# Patient Record
Sex: Male | Born: 1970
Health system: Southern US, Community
[De-identification: ages and names within clinical notes are randomized; demographics above are authoritative.]

## PROBLEM LIST (undated history)

## (undated) DIAGNOSIS — I471 Supraventricular tachycardia, unspecified: Secondary | ICD-10-CM

## (undated) DIAGNOSIS — S3992XA Unspecified injury of lower back, initial encounter: Secondary | ICD-10-CM

## (undated) HISTORY — PX: KNEE SURGERY: SHX244

## (undated) HISTORY — DX: Unspecified injury of lower back, initial encounter: S39.92XA

## (undated) HISTORY — PX: CARDIAC SURGERY: SHX584

## (undated) HISTORY — PX: HAND SURGERY: SHX662

## (undated) HISTORY — PX: ANKLE SURGERY: SHX546

---

## 2004-04-14 HISTORY — PX: CARDIAC SURGERY: SHX584

## 2014-11-18 ENCOUNTER — Emergency Department
Admission: EM | Admit: 2014-11-18 | Discharge: 2014-11-18 | Disposition: A | Discharge status: Home / Self Care | Payer: BLUE CROSS/BLUE SHIELD | Source: Home / Self Care | Attending: Family Medicine | Admitting: Family Medicine

## 2014-11-18 ENCOUNTER — Encounter: Payer: Self-pay | Admitting: Emergency Medicine

## 2014-11-18 DIAGNOSIS — M940 Chondrocostal junction syndrome [Tietze]: Secondary | ICD-10-CM

## 2014-11-18 MED ORDER — HYDROCODONE-ACETAMINOPHEN 5-325 MG PO TABS
ORAL_TABLET | ORAL | Status: DC
Start: 2014-11-18 — End: 2015-02-27

## 2014-11-18 MED ORDER — MELOXICAM 15 MG PO TABS: 15.0000 mg | ORAL_TABLET | Freq: Every day | ORAL | Status: DC

## 2014-11-18 NOTE — Discharge Instructions (Signed)
Apply ice pack for 20 to 30 minutes, 3 to 4 times daily  Continue until pain decreases.  ° ° °Costochondritis °Costochondritis, sometimes called Tietze syndrome, is a swelling and irritation (inflammation) of the tissue (cartilage) that connects your ribs with your breastbone (sternum). It causes pain in the chest and rib area. Costochondritis usually goes away on its own over time. It can take up to 6 weeks or longer to get better, especially if you are unable to limit your activities. °CAUSES  °Some cases of costochondritis have no known cause. Possible causes include: °· Injury (trauma). °· Exercise or activity such as lifting. °· Severe coughing. °SIGNS AND SYMPTOMS °· Pain and tenderness in the chest and rib area. °· Pain that gets worse when coughing or taking deep breaths. °· Pain that gets worse with specific movements. °DIAGNOSIS  °Your health care provider will do a physical exam and ask about your symptoms. Chest X-rays or other tests may be done to rule out other problems. °TREATMENT  °Costochondritis usually goes away on its own over time. Your health care provider may prescribe medicine to help relieve pain. °HOME CARE INSTRUCTIONS  °· Avoid exhausting physical activity. Try not to strain your ribs during normal activity. This would include any activities using chest, abdominal, and side muscles, especially if heavy weights are used. °· Apply ice to the affected area for the first 2 days after the pain begins. °¨ Put ice in a plastic bag. °¨ Place a towel between your skin and the bag. °¨ Leave the ice on for 20 minutes, 2-3 times a day. °· Only take over-the-counter or prescription medicines as directed by your health care provider. °SEEK MEDICAL CARE IF: °· You have redness or swelling at the rib joints. These are signs of infection. °· Your pain does not go away despite rest or medicine. °SEEK IMMEDIATE MEDICAL CARE IF:  °· Your pain increases or you are very uncomfortable. °· You have shortness of  breath or difficulty breathing. °· You cough up blood. °· You have worse chest pains, sweating, or vomiting. °· You have a fever or persistent symptoms for more than 2-3 days. °· You have a fever and your symptoms suddenly get worse. °MAKE SURE YOU:  °· Understand these instructions. °· Will watch your condition. °· Will get help right away if you are not doing well or get worse. °Document Released: 01/08/2005 Document Revised: 01/19/2013 Document Reviewed: 11/02/2012 °[employer]® Patient Information ©2015 [employer]. This information is not intended to replace advice given to you by your health care provider. Make sure you discuss any questions you have with your health care provider. ° °
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
## 2015-02-27 ENCOUNTER — Encounter: Payer: Self-pay | Admitting: *Deleted

## 2015-02-27 ENCOUNTER — Emergency Department
Admission: EM | Admit: 2015-02-27 | Discharge: 2015-02-27 | Disposition: A | Discharge status: Home / Self Care | Payer: BLUE CROSS/BLUE SHIELD | Source: Home / Self Care | Attending: Family Medicine | Admitting: Family Medicine

## 2015-02-27 DIAGNOSIS — R220 Localized swelling, mass and lump, head: Secondary | ICD-10-CM

## 2015-02-27 MED ORDER — DIPHENHYDRAMINE HCL 12.5 MG/5ML PO ELIX
25.0000 mg | ORAL_SOLUTION | Freq: Every day | ORAL | Status: DC | PRN
  Administered 2015-02-27: 25 mg via ORAL

## 2015-02-27 MED ORDER — DIPHENHYDRAMINE HCL 25 MG PO TABS: 25.0000 mg | ORAL_TABLET | ORAL | Status: DC | PRN

## 2015-02-27 MED ORDER — FAMOTIDINE 20 MG PO TABS
20.0000 mg | ORAL_TABLET | Freq: Every day | ORAL | Status: DC
  Administered 2015-02-27: 20 mg via ORAL

## 2015-02-27 MED ORDER — METHYLPREDNISOLONE SODIUM SUCC 40 MG IJ SOLR
80.0000 mg | Freq: Once | INTRAMUSCULAR | Status: AC
  Administered 2015-02-27: 80 mg via INTRAMUSCULAR

## 2015-02-27 MED ORDER — DIPHENHYDRAMINE HCL 50 MG/ML IJ SOLN: 25.0000 mg | Freq: Once | INTRAMUSCULAR | Status: DC

## 2015-02-27 NOTE — ED Notes (Signed)
Pt c/o upper lip swelling x 1 hour.
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
## 2015-02-27 NOTE — Discharge Instructions (Signed)
Call 911 or go to closest ER if symptoms worsen including worsening swelling, throat or tongue swelling, difficulty breathing or swallowing, hives, or other new concerning symptoms develop.

## 2015-02-28 ENCOUNTER — Telehealth: Payer: Self-pay | Admitting: *Deleted

## 2015-06-18 ENCOUNTER — Encounter: Payer: Self-pay | Admitting: Sports Medicine

## 2015-06-18 ENCOUNTER — Ambulatory Visit (INDEPENDENT_AMBULATORY_CARE_PROVIDER_SITE_OTHER): Payer: BLUE CROSS/BLUE SHIELD

## 2015-06-18 ENCOUNTER — Ambulatory Visit (INDEPENDENT_AMBULATORY_CARE_PROVIDER_SITE_OTHER): Payer: BLUE CROSS/BLUE SHIELD | Admitting: Sports Medicine

## 2015-06-18 VITALS — BP 123/82 | HR 62 | Wt 175.0 lb

## 2015-06-18 DIAGNOSIS — S43109A Unspecified dislocation of unspecified acromioclavicular joint, initial encounter: Secondary | ICD-10-CM | POA: Insufficient documentation

## 2015-06-18 DIAGNOSIS — M25511 Pain in right shoulder: Secondary | ICD-10-CM | POA: Diagnosis not present

## 2015-06-18 DIAGNOSIS — R202 Paresthesia of skin: Secondary | ICD-10-CM | POA: Diagnosis not present

## 2015-06-18 DIAGNOSIS — S43101A Unspecified dislocation of right acromioclavicular joint, initial encounter: Secondary | ICD-10-CM

## 2015-06-18 DIAGNOSIS — M5412 Radiculopathy, cervical region: Secondary | ICD-10-CM | POA: Insufficient documentation

## 2015-06-18 MED ORDER — MELOXICAM 15 MG PO TABS: ORAL_TABLET | ORAL | Status: DC
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
## 2015-06-18 NOTE — Assessment & Plan Note (Signed)
X-rays, meloxicam, return in one month, injection if no better. Advised of the natural history of a shoulder separation.

## 2015-07-16 ENCOUNTER — Ambulatory Visit: Payer: BLUE CROSS/BLUE SHIELD | Admitting: Sports Medicine

## 2015-07-17 ENCOUNTER — Ambulatory Visit (INDEPENDENT_AMBULATORY_CARE_PROVIDER_SITE_OTHER): Payer: BLUE CROSS/BLUE SHIELD | Admitting: Sports Medicine

## 2015-07-17 VITALS — BP 125/73 | HR 60 | Resp 16 | Wt 175.5 lb

## 2015-07-17 DIAGNOSIS — S43101A Unspecified dislocation of right acromioclavicular joint, initial encounter: Secondary | ICD-10-CM | POA: Diagnosis not present

## 2015-07-17 DIAGNOSIS — R202 Paresthesia of skin: Secondary | ICD-10-CM

## 2015-07-17 NOTE — Assessment & Plan Note (Signed)
Confirmed on x-ray, pain is tolerable with meloxicam. No changes, we did discuss injection therapy.

## 2015-07-17 NOTE — Progress Notes (Signed)
  Subjective:    CC: follow-up  HPI: Right acromioclavicular separation: Pain is tolerable on meloxicam. Understands that this is simply going to take time.  Paresthesias and right hand: Pain over the second MCP as well as with repeated clicking on his mouse. He did have some paresthesias so we are still awaiting nerve conduction and EMG.  Past medical history, Surgical history, Family history not pertinant except as noted below, Social history, Allergies, and medications have been entered into the medical record, reviewed, and no changes needed.   Review of Systems: No fevers, chills, night sweats, weight loss, chest pain, or shortness of breath.   Objective:    General: Well Developed, well nourished, and in no acute distress.  Neuro: Alert and oriented x3, extra-ocular muscles intact, sensation grossly intact.  HEENT: Normocephalic, atraumatic, pupils equal round reactive to light, neck supple, no masses, no lymphadenopathy, thyroid nonpalpable.  Skin: Warm and dry, no rashes. Cardiac: Regular rate and rhythm, no murmurs rubs or gallops, no lower extremity edema.  Respiratory: Clear to auscultation bilaterally. Not using accessory muscles, speaking in full sentences. Right hand: Slightly swollen MCP, second, but no palpable tenderness.  Impression and Recommendations:    I spent 25 minutes with this patient, greater than 50% was face-to-face time counseling regarding the above diagnoses

## 2015-07-17 NOTE — Assessment & Plan Note (Signed)
I do suspect that this represents a second MCP synovitis versus extensor indicis tenosynovitis. If insufficient response by the next visit we will proceed with MRI of the hand, still awaiting nerve conduction and EMG.

## 2015-07-30 ENCOUNTER — Encounter: Payer: Self-pay | Admitting: Neurology

## 2015-07-30 ENCOUNTER — Ambulatory Visit (INDEPENDENT_AMBULATORY_CARE_PROVIDER_SITE_OTHER): Payer: BLUE CROSS/BLUE SHIELD | Admitting: Neurology

## 2015-07-30 ENCOUNTER — Ambulatory Visit (INDEPENDENT_AMBULATORY_CARE_PROVIDER_SITE_OTHER): Payer: Self-pay | Admitting: Neurology

## 2015-07-30 DIAGNOSIS — R202 Paresthesia of skin: Secondary | ICD-10-CM
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
## 2015-07-30 NOTE — Progress Notes (Signed)
Please refer to EMG and NCV procedure note. 

## 2016-02-08 ENCOUNTER — Other Ambulatory Visit: Payer: Self-pay | Admitting: Sports Medicine

## 2016-02-08 DIAGNOSIS — S43101A Unspecified dislocation of right acromioclavicular joint, initial encounter: Secondary | ICD-10-CM

## 2016-03-03 ENCOUNTER — Ambulatory Visit (INDEPENDENT_AMBULATORY_CARE_PROVIDER_SITE_OTHER): Payer: BLUE CROSS/BLUE SHIELD

## 2016-03-03 ENCOUNTER — Ambulatory Visit (INDEPENDENT_AMBULATORY_CARE_PROVIDER_SITE_OTHER): Payer: BLUE CROSS/BLUE SHIELD | Admitting: Sports Medicine

## 2016-03-03 ENCOUNTER — Encounter: Payer: Self-pay | Admitting: Sports Medicine

## 2016-03-03 DIAGNOSIS — M7062 Trochanteric bursitis, left hip: Secondary | ICD-10-CM

## 2016-03-03 DIAGNOSIS — M25552 Pain in left hip: Secondary | ICD-10-CM

## 2016-03-03 DIAGNOSIS — M5412 Radiculopathy, cervical region: Secondary | ICD-10-CM

## 2016-03-03 DIAGNOSIS — M7061 Trochanteric bursitis, right hip: Secondary | ICD-10-CM | POA: Diagnosis not present

## 2016-03-03 DIAGNOSIS — M1612 Unilateral primary osteoarthritis, left hip: Secondary | ICD-10-CM | POA: Insufficient documentation

## 2016-03-03 MED ORDER — GABAPENTIN 100 MG PO CAPS: 100.0000 mg | ORAL_CAPSULE | Freq: Every day | ORAL | 11 refills | Status: DC

## 2016-03-03 MED ORDER — PREDNISONE 50 MG PO TABS: ORAL_TABLET | ORAL | 0 refills | Status: DC

## 2016-03-03 NOTE — Assessment & Plan Note (Signed)
Nerve conduction study in the past simply showed some evidence of chronic median nerve injury. Symptoms today seem to be localized to the C7 nerve root. Home rehabilitation exercises, x-rays, prednisone, gabapentin 100 mg in the morning.

## 2016-03-03 NOTE — Assessment & Plan Note (Signed)
Injection as above. Home rehabilitation exercises. Hip x-rays. Return in one month.

## 2016-03-03 NOTE — Progress Notes (Signed)
   Subjective:    I'm seeing this patient as a consultation for:  Dr. Jeannett SeniorStephen Nelson  CC: Right arm pain, left hip pain  HPI: Right arm pain: Has been occurring for several months, worse during his long hours at work clicking on the computer, gets radiating pain down the posterior aspect of his arm, all the way down to his second and sometimes third finger. Feels better with his arm abducted behind his head. Occasional numbness and tingling.  Left hip pain: Localized over the greater, moderate, persistent without radiation.  Past medical history:  Negative.  See flowsheet/record as well for more information.  Surgical history: Negative.  See flowsheet/record as well for more information.  Family history: Negative.  See flowsheet/record as well for more information.  Social history: Negative.  See flowsheet/record as well for more information.  Allergies, and medications have been entered into the medical record, reviewed, and no changes needed.   Review of Systems: No headache, visual changes, nausea, vomiting, diarrhea, constipation, dizziness, abdominal pain, skin rash, fevers, chills, night sweats, weight loss, swollen lymph nodes, body aches, joint swelling, muscle aches, chest pain, shortness of breath, mood changes, visual or auditory hallucinations.   Objective:   General: Well Developed, well nourished, and in no acute distress.  Neuro/Psych: Alert and oriented x3, extra-ocular muscles intact, able to move all 4 extremities, sensation grossly intact. Skin: Warm and dry, no rashes noted.  Respiratory: Not using accessory muscles, speaking in full sentences, trachea midline.  Cardiovascular: Pulses palpable, no extremity edema. Abdomen: Does not appear distended. Neck: Negative spurling's Full neck range of motion Grip strength and sensation normal in bilateral hands Strength good C4 to T1 distribution No sensory change to C4 to T1 Reflexes normal Left Hip: ROM IR: 60 Deg, ER:  60 Deg, Flexion: 120 Deg, Extension: 100 Deg, Abduction: 45 Deg, Adduction: 45 Deg Strength IR: 5/5, ER: 5/5, Flexion: 5/5, Extension: 5/5, Abduction: 5/5, Adduction: 5/5 Pelvic alignment unremarkable to inspection and palpation. Standing hip rotation and gait without trendelenburg / unsteadiness. Greater trochanter with severe tenderness to palpation. No tenderness over piriformis. No SI joint tenderness and normal minimal SI movement.  Procedure: Real-time Ultrasound Guided Injection of left greater trochanteric bursa Device: GE Logiq E  Verbal informed consent obtained.  Time-out conducted.  Noted no overlying erythema, induration, or other signs of local infection.  Skin prepped in a sterile fashion.  Local anesthesia: Topical Ethyl chloride.  With sterile technique and under real time ultrasound guidance: 22-gauge spinal needle advanced just deep to the insertion of the gluteus medius, 1 mL kenalog 40, 2 mL lidocaine, 2 mL Marcaine injected easily.  Completed without difficulty  Pain immediately resolved suggesting accurate placement of the medication.  Advised to call if fevers/chills, erythema, induration, drainage, or persistent bleeding.  Images permanently stored and available for review in the ultrasound unit.  Impression: Technically successful ultrasound guided injection.  Impression and Recommendations:   This case required medical decision making of moderate complexity.  Right cervical radiculopathy Nerve conduction study in the past simply showed some evidence of chronic median nerve injury. Symptoms today seem to be localized to the C7 nerve root. Home rehabilitation exercises, x-rays, prednisone, gabapentin 100 mg in the morning.  Greater trochanteric bursitis, left Injection as above. Home rehabilitation exercises. Hip x-rays. Return in one month.

## 2016-03-04 ENCOUNTER — Encounter: Payer: Self-pay | Admitting: Sports Medicine

## 2016-03-31 ENCOUNTER — Encounter: Payer: Self-pay | Admitting: Sports Medicine

## 2016-03-31 ENCOUNTER — Ambulatory Visit (INDEPENDENT_AMBULATORY_CARE_PROVIDER_SITE_OTHER): Payer: BLUE CROSS/BLUE SHIELD | Admitting: Sports Medicine

## 2016-03-31 DIAGNOSIS — M7062 Trochanteric bursitis, left hip: Secondary | ICD-10-CM | POA: Diagnosis not present

## 2016-03-31 DIAGNOSIS — M79671 Pain in right foot: Secondary | ICD-10-CM | POA: Diagnosis not present

## 2016-03-31 DIAGNOSIS — M5412 Radiculopathy, cervical region: Secondary | ICD-10-CM | POA: Diagnosis not present

## 2016-03-31 NOTE — Assessment & Plan Note (Signed)
C7 distribution radiculitis, continue rehabilitation exercises, gabapentin seems to control pain fairly well. Did have mild multilevel cervical degenerative changes, no need for MRI just yet as symptoms are controlled with conservative measures.

## 2016-03-31 NOTE — Assessment & Plan Note (Signed)
Did better after injection, had a bit of recurrence of pain but will focus on the rehabilitation exercises.

## 2016-03-31 NOTE — Assessment & Plan Note (Signed)
Right foot pain under the navicular consistent with tibialis posterior tendinitis versus navicular stress injury. He does have past cavus, will return for custom molded orthotics.

## 2016-03-31 NOTE — Progress Notes (Signed)
   Subjective:    I'm seeing this patient as a consultation for:  Dr. Jeannett SeniorStephen Hux  CC: Foot pain  HPI: For the past several months this pleasant 45 year old male has had pain that he localizes on the plantar aspect of his right foot just under the navicular, moderate, persistent, radiation, no trauma, no change in surfaces or footwear.  Left trochanteric bursitis: Did better after injection, he things he overdid it playing ping-pong, but overall is calming down again.  Right cervical radiculopathy: C7 distribution, essentially resolved with low-dose gabapentin and rehabilitation exercises.  Past medical history:  Negative.  See flowsheet/record as well for more information.  Surgical history: Negative.  See flowsheet/record as well for more information.  Family history: Negative.  See flowsheet/record as well for more information.  Social history: Negative.  See flowsheet/record as well for more information.  Allergies, and medications have been entered into the medical record, reviewed, and no changes needed.   Review of Systems: No headache, visual changes, nausea, vomiting, diarrhea, constipation, dizziness, abdominal pain, skin rash, fevers, chills, night sweats, weight loss, swollen lymph nodes, body aches, joint swelling, muscle aches, chest pain, shortness of breath, mood changes, visual or auditory hallucinations.   Objective:   General: Well Developed, well nourished, and in no acute distress.  Neuro/Psych: Alert and oriented x3, extra-ocular muscles intact, able to move all 4 extremities, sensation grossly intact. Skin: Warm and dry, no rashes noted.  Respiratory: Not using accessory muscles, speaking in full sentences, trachea midline.  Cardiovascular: Pulses palpable, no extremity edema. Abdomen: Does not appear distended. Right Foot: No visible erythema or swelling. Range of motion is full in all directions. Strength is 5/5 in all directions. No hallux valgus. No pes  cavus or pes planus. No abnormal callus noted. No pain over the navicular prominence, or base of fifth metatarsal. No tenderness to palpation of the calcaneal insertion of plantar fascia. No pain at the Achilles insertion. No pain over the calcaneal bursa. No pain of the retrocalcaneal bursa. Tender to palpation over the navicular No hallux rigidus or limitus. No tenderness palpation over interphalangeal joints. No pain with compression of the metatarsal heads. Neurovascularly intact distally.  Impression and Recommendations:   This case required medical decision making of moderate complexity.  Right foot pain Right foot pain under the navicular consistent with tibialis posterior tendinitis versus navicular stress injury. He does have past cavus, will return for custom molded orthotics.  Greater trochanteric bursitis, left Did better after injection, had a bit of recurrence of pain but will focus on the rehabilitation exercises.  Right cervical radiculopathy C7 distribution radiculitis, continue rehabilitation exercises, gabapentin seems to control pain fairly well. Did have mild multilevel cervical degenerative changes, no need for MRI just yet as symptoms are controlled with conservative measures.

## 2016-04-01 ENCOUNTER — Encounter: Payer: Self-pay | Admitting: Sports Medicine

## 2016-04-01 ENCOUNTER — Ambulatory Visit (INDEPENDENT_AMBULATORY_CARE_PROVIDER_SITE_OTHER): Payer: BLUE CROSS/BLUE SHIELD | Admitting: Sports Medicine

## 2016-04-01 DIAGNOSIS — M79671 Pain in right foot: Secondary | ICD-10-CM | POA: Diagnosis not present

## 2016-04-01 NOTE — Assessment & Plan Note (Signed)
Suspect right tibialis posterior tendinitis versus navicular stress injury, custom orthotics as above for pes cavus. Return in one month.

## 2016-04-01 NOTE — Progress Notes (Signed)

## 2016-09-16 DIAGNOSIS — M47812 Spondylosis without myelopathy or radiculopathy, cervical region: Secondary | ICD-10-CM | POA: Insufficient documentation

## 2016-09-26 DIAGNOSIS — M51369 Other intervertebral disc degeneration, lumbar region without mention of lumbar back pain or lower extremity pain: Secondary | ICD-10-CM | POA: Insufficient documentation

## 2016-12-17 ENCOUNTER — Emergency Department (INDEPENDENT_AMBULATORY_CARE_PROVIDER_SITE_OTHER)
Admission: EM | Admit: 2016-12-17 | Discharge: 2016-12-17 | Disposition: A | Discharge status: Home / Self Care | Payer: BLUE CROSS/BLUE SHIELD | Source: Home / Self Care | Attending: Family Medicine | Admitting: Family Medicine

## 2016-12-17 ENCOUNTER — Other Ambulatory Visit: Payer: Self-pay | Admitting: Family Medicine

## 2016-12-17 ENCOUNTER — Encounter: Payer: Self-pay | Admitting: *Deleted

## 2016-12-17 ENCOUNTER — Emergency Department (INDEPENDENT_AMBULATORY_CARE_PROVIDER_SITE_OTHER): Payer: BLUE CROSS/BLUE SHIELD

## 2016-12-17 DIAGNOSIS — K5909 Other constipation: Secondary | ICD-10-CM

## 2016-12-17 DIAGNOSIS — R1030 Lower abdominal pain, unspecified: Secondary | ICD-10-CM

## 2016-12-17 DIAGNOSIS — R109 Unspecified abdominal pain: Secondary | ICD-10-CM

## 2016-12-17 DIAGNOSIS — R140 Abdominal distension (gaseous): Secondary | ICD-10-CM

## 2016-12-17 LAB — POCT URINALYSIS DIP (MANUAL ENTRY)
Bilirubin, UA: NEGATIVE
Blood, UA: NEGATIVE
Glucose, UA: NEGATIVE mg/dL
Ketones, POC UA: NEGATIVE mg/dL
LEUKOCYTES UA: NEGATIVE
Nitrite, UA: NEGATIVE
PROTEIN UA: NEGATIVE mg/dL
Spec Grav, UA: 1.01 (ref 1.010–1.025)
UROBILINOGEN UA: 1 U/dL
pH, UA: 7 (ref 5.0–8.0)

## 2016-12-17 LAB — POCT CBC W AUTO DIFF (K'VILLE URGENT CARE)

## 2016-12-17 NOTE — Discharge Instructions (Signed)
Recommend Miralax, one capful daily mixed with 4 to 8 ounces liquid.

## 2016-12-17 NOTE — ED Provider Notes (Signed)
[facility]    CSN: [id number] Arrival date & time: 12/17/16  1038     History   Chief Complaint Chief Complaint  Patient presents with  . Abdominal Pain    HPI Ulmer Degen is a 46 y.o. male.   Patient complains of several year history of infrequently occurring brief right lower quadrant pain (about once per week).  During the past five days he has had persistent constipation, passing only some fluid after trying an enema two days ago.  He tried a second enema yesterday without response.  He has note increased flatus.  His stools had been normal prior to five days ago.  No nausea/vomiting.  No melena or hematochezia.  No history of abdominal surgery.  No GU symptoms. He has a family history of hypothyroid (maternal grandmother).   The history is provided by the patient.  Abdominal Pain  Pain location:  LLQ and RLQ Pain quality: aching and bloating   Pain radiates to:  Does not radiate Pain severity:  Mild Onset quality:  Sudden Duration:  5 hours Timing:  Constant Progression:  Unchanged Chronicity:  New Context: not awakening from sleep, not diet changes, not eating, not laxative use, not medication withdrawal, not previous surgeries, not recent illness, not recent travel, not retching, not sick contacts, not suspicious food intake and not trauma   Relieved by:  Nothing Worsened by:  Nothing Ineffective treatments: enemas and laxative. Associated symptoms: anorexia, belching, constipation, fatigue and flatus   Associated symptoms: no chest pain, no chills, no cough, no diarrhea, no dysuria, no fever, no hematemesis, no hematochezia, no hematuria, no melena, no nausea, no shortness of breath, no sore throat and no vomiting   Risk factors: has not had multiple surgeries     History reviewed. No pertinent past medical history.  Patient Active Problem List   Diagnosis Date Noted  . Right foot pain 03/31/2016  . Greater trochanteric bursitis, left 03/03/2016  .  AC separation, type 2 06/18/2015  . Right cervical radiculopathy 06/18/2015    Past Surgical History:  Procedure Laterality Date  . ANKLE SURGERY    . CARDIAC SURGERY    . HAND SURGERY    . KNEE SURGERY         Home Medications    Prior to Admission medications   Medication Sig Start Date End Date Taking? Authorizing Provider  aspirin 81 MG tablet Take 81 mg by mouth daily.   Yes [provider]  busPIRone (BUSPAR) 15 MG tablet Take by mouth. 03/01/13  Yes [provider]  gabapentin (NEURONTIN) 100 MG capsule Take 1 capsule (100 mg total) by mouth daily with breakfast. 03/03/16  Yes Monica Becton, MD  ibuprofen (ADVIL,MOTRIN) 400 MG tablet Take 400 mg by mouth every 6 (six) hours as needed.   Yes [provider]  UNKNOWN TO PATIENT    Yes [provider]    Family History History reviewed. No pertinent family history.  Social History Social History  Substance Use Topics  . Smoking status: Never Smoker  . Smokeless tobacco: Never Used  . Alcohol use No     Allergies   Patient has no known allergies.   Review of Systems Review of Systems  Constitutional: Positive for fatigue. Negative for chills and fever.  HENT: Negative for sore throat.   Respiratory: Negative for cough and shortness of breath.   Cardiovascular: Negative for chest pain.  Gastrointestinal: Positive for abdominal pain, anorexia, constipation and flatus. Negative for  diarrhea, hematemesis, hematochezia, melena, nausea and vomiting.  Genitourinary: Negative for dysuria and hematuria.  All other systems reviewed and are negative.    Physical Exam Triage Vital Signs ED Triage Vitals  Enc Vitals Group     BP 12/17/16 1054 131/89     Pulse Rate 12/17/16 1054 62     Resp 12/17/16 1054 16     Temp 12/17/16 1054 98 F (36.7 C)     Temp Source 12/17/16 1054 Oral     SpO2 12/17/16 1054 100 %     Weight 12/17/16 1055 187 lb (84.8 kg)     Height 12/17/16  1055 5\' 8"  (1.727 m)     Head Circumference --      Peak Flow --      Pain Score 12/17/16 1055 2     Pain Loc --      Pain Edu? --      Excl. in [place]? --    No data found.   Updated Vital Signs BP 131/89 (BP Location: Left Arm)   Pulse 62   Temp 98 F (36.7 C) (Oral)   Resp 16   Ht 5\' 8"  (1.727 m)   Wt 187 lb (84.8 kg)   SpO2 100%   BMI 28.43 kg/m   Visual Acuity Right Eye Distance:   Left Eye Distance:   Bilateral Distance:    Right Eye Near:   Left Eye Near:    Bilateral Near:     Physical Exam Nursing notes and Vital Signs reviewed. Appearance:  Patient appears stated age, and in no acute distress Eyes:  Pupils are equal, round, and reactive to light and accomodation.  Extraocular movement is intact.  Conjunctivae are not inflamed  Ears:  Normal externally. Nose:  Normal   Pharynx:  Normal; moist mucous membranes  Neck:  Supple.  No adenopathy.  Lungs:  Clear to auscultation.  Breath sounds are equal.  Moving air well. Heart:  Regular rate and rhythm without murmurs, rubs, or gallops.  Abdomen:   Vague tenderness right lower quadrant  without masses or hepatosplenomegaly.  Bowel sounds are present.  No CVA or flank tenderness.  No rebound tenderness. Extremities:  No edema.  Skin:  No rash present.    UC Treatments / Results  Labs (all labs ordered are listed, but only abnormal results are displayed) Labs Reviewed  TSH  POCT CBC W AUTO DIFF ([facility]):  WBC 7.7; LY 18.4; MO 3.7; GR 77.9; Hgb 15.0; Platelets 275   POCT URINALYSIS DIP (MANUAL ENTRY) negative    EKG  EKG Interpretation None       Radiology Dg Abdomen 1 View  Result Date: 12/17/2016 CLINICAL DATA:  Abdominal pain, bloating EXAM: ABDOMEN - 1 VIEW COMPARISON:  None. FINDINGS: The bowel gas pattern is normal. No radio-opaque calculi or other significant radiographic abnormality are seen. IMPRESSION: Negative. Electronically Signed   By: Charlett NoseKevin  Dover M.D.   On: 12/17/2016 11:43     Procedures Procedures (including critical care time)  Medications Ordered in UC Medications - No data to display   Initial Impression / Assessment and Plan / UC Course  I have reviewed the triage vital signs and the nursing notes.  Pertinent labs & imaging results that were available during my care of the patient were reviewed by me and considered in my medical decision making (see chart for details).    Unremarkable bowel gas pattern on KUB. Normal CBC and urinalysis reassuring.  TSH pending. Recommend  Miralax, one capful daily mixed with 4 to 8 ounces liquid. Followup with PCP if symptoms persist.    Final Clinical Impressions(s) / UC Diagnoses   Final diagnoses:  Lower abdominal pain  Other constipation    New Prescriptions New Prescriptions   No medications on file         Lattie Haw, MD 12/17/16 1258

## 2016-12-17 NOTE — ED Triage Notes (Signed)
Pt c/o RLQ abd intermittent "pinching" sensation for "a few years". Since 12/12/16 he reports no BM, bloating and pain in his RLQ. He has done 2 enemas, taken maalox and increased his fluid intake without relief.

## 2016-12-18 ENCOUNTER — Telehealth: Payer: Self-pay | Admitting: *Deleted

## 2016-12-18 LAB — TSH: TSH: 1.48 m[IU]/L (ref 0.40–4.50)

## 2016-12-18 NOTE — Telephone Encounter (Signed)
Patient advised of TSH results.

## 2017-03-18 ENCOUNTER — Other Ambulatory Visit: Payer: Self-pay | Admitting: Sports Medicine

## 2017-03-18 DIAGNOSIS — M5412 Radiculopathy, cervical region: Secondary | ICD-10-CM

## 2018-03-10 ENCOUNTER — Other Ambulatory Visit: Payer: Self-pay | Admitting: Sports Medicine

## 2018-03-10 DIAGNOSIS — M5412 Radiculopathy, cervical region: Secondary | ICD-10-CM

## 2018-11-23 DIAGNOSIS — M47812 Spondylosis without myelopathy or radiculopathy, cervical region: Secondary | ICD-10-CM | POA: Diagnosis not present

## 2018-11-23 DIAGNOSIS — R635 Abnormal weight gain: Secondary | ICD-10-CM | POA: Diagnosis not present

## 2018-11-23 DIAGNOSIS — Z0000 Encounter for general adult medical examination without abnormal findings: Secondary | ICD-10-CM | POA: Diagnosis not present

## 2018-11-23 DIAGNOSIS — R7989 Other specified abnormal findings of blood chemistry: Secondary | ICD-10-CM | POA: Diagnosis not present

## 2019-04-20 DIAGNOSIS — L309 Dermatitis, unspecified: Secondary | ICD-10-CM | POA: Diagnosis not present

## 2019-04-20 HISTORY — DX: Dermatitis, unspecified: L30.9

## 2019-06-22 DIAGNOSIS — L821 Other seborrheic keratosis: Secondary | ICD-10-CM | POA: Diagnosis not present

## 2019-06-22 DIAGNOSIS — L649 Androgenic alopecia, unspecified: Secondary | ICD-10-CM | POA: Diagnosis not present

## 2019-06-22 DIAGNOSIS — D2270 Melanocytic nevi of unspecified lower limb, including hip: Secondary | ICD-10-CM | POA: Diagnosis not present

## 2019-06-22 DIAGNOSIS — L816 Other disorders of diminished melanin formation: Secondary | ICD-10-CM | POA: Diagnosis not present

## 2019-07-20 DIAGNOSIS — Z23 Encounter for immunization: Secondary | ICD-10-CM | POA: Diagnosis not present

## 2019-09-02 ENCOUNTER — Other Ambulatory Visit: Payer: Self-pay

## 2019-09-02 ENCOUNTER — Emergency Department (INDEPENDENT_AMBULATORY_CARE_PROVIDER_SITE_OTHER)
Admission: EM | Admit: 2019-09-02 | Discharge: 2019-09-02 | Disposition: A | Discharge status: Home / Self Care | Payer: BC Managed Care – PPO | Source: Home / Self Care | Attending: Family Medicine | Admitting: Family Medicine

## 2019-09-02 ENCOUNTER — Encounter: Payer: Self-pay | Admitting: Emergency Medicine

## 2019-09-02 DIAGNOSIS — S76312A Strain of muscle, fascia and tendon of the posterior muscle group at thigh level, left thigh, initial encounter: Secondary | ICD-10-CM | POA: Diagnosis not present

## 2019-09-02 HISTORY — DX: Supraventricular tachycardia: I47.1

## 2019-09-02 HISTORY — DX: Supraventricular tachycardia, unspecified: I47.10

## 2019-09-02 MED ORDER — CYCLOBENZAPRINE HCL 10 MG PO TABS: 10.0000 mg | ORAL_TABLET | Freq: Three times a day (TID) | ORAL | 1 refills | Status: DC | PRN

## 2019-09-02 NOTE — ED Provider Notes (Signed)
[facility]    CSN: [id number] Arrival date & time: 09/02/19  1034      History   Chief Complaint Chief Complaint  Patient presents with  . Leg Pain    left thigh    HPI George Nelson is a 49 y.o. male.   At 2130 last night, while approaching a stair, patient suddenly felt sharp pain in his left posterior thigh as he lifted his foot to the first stair tread.  He has had persistent pain in his posterior thigh with walking, climbing stairs etc.      Leg Pain Location:  Leg Time since incident:  1 day Injury: no   Leg location:  L upper leg Pain details:    Quality:  Shooting and sharp   Radiates to:  Does not radiate   Severity:  Severe   Onset quality:  Sudden   Duration:  1 day   Timing:  Constant   Progression:  Unchanged Chronicity:  New Prior injury to area:  No Relieved by:  Nothing Worsened by:  Bearing weight and activity Ineffective treatments:  None tried Associated symptoms: decreased ROM, muscle weakness, swelling and tingling   Associated symptoms: no fatigue, no fever and no numbness     Past Medical History:  Diagnosis Date  . SVT (supraventricular tachycardia) [facility])     Patient Active Problem List   Diagnosis Date Noted  . Right foot pain 03/31/2016  . Greater trochanteric bursitis, left 03/03/2016  . AC separation, type 2 06/18/2015  . Right cervical radiculopathy 06/18/2015    Past Surgical History:  Procedure Laterality Date  . ANKLE SURGERY    . CARDIAC SURGERY  2006   Ablation   . HAND SURGERY    . KNEE SURGERY         Home Medications    Prior to Admission medications   Medication Sig Start Date End Date Taking? Authorizing Provider  aspirin 81 MG tablet Take 81 mg by mouth daily.   Yes [provider]  ibuprofen (ADVIL,MOTRIN) 400 MG tablet Take 400 mg by mouth every 6 (six) hours as needed.   Yes [provider]  metoprolol tartrate (LOPRESSOR) 50 MG tablet TAKE 1/2 TO 1 TABLET BY MOUTH  TWICE DAILY AS NEEDED 08/17/19  Yes [provider]  busPIRone (BUSPAR) 15 MG tablet Take by mouth. 03/01/13   [provider]  cyclobenzaprine (FLEXERIL) 10 MG tablet Take 1 tablet (10 mg total) by mouth 3 (three) times daily as needed for muscle spasms. 09/02/19   Lattie Haw, MD  gabapentin (NEURONTIN) 100 MG capsule TAKE ONE CAPSULE BY MOUTH ONCE DAILY WITH  BREAKFAST 03/18/17   Monica Becton, MD  UNKNOWN TO PATIENT     [provider]    Family History Family History  Problem Relation Age of Onset  . Healthy Mother   . Healthy Father   . Healthy Brother     Social History Social History   Tobacco Use  . Smoking status: Never Smoker  . Smokeless tobacco: Never Used  Substance Use Topics  . Alcohol use: No  . Drug use: No     Allergies   Patient has no known allergies.   Review of Systems Review of Systems  Constitutional: Positive for activity change. Negative for chills, diaphoresis, fatigue and fever.  Respiratory: Negative for chest tightness and shortness of breath.   Cardiovascular: Negative for chest pain and leg swelling.  Musculoskeletal: Negative for joint swelling.  Skin: Negative for color change.  All other systems reviewed and are negative.    Physical Exam Triage Vital Signs ED Triage Vitals  Enc Vitals Group     BP 09/02/19 1049 (!) 133/95     Pulse Rate 09/02/19 1049 67     Resp 09/02/19 1049 18     Temp 09/02/19 1049 98.7 F (37.1 C)     Temp Source 09/02/19 1049 Oral     SpO2 09/02/19 1049 98 %     Weight 09/02/19 1042 195 lb (88.5 kg)     Height 09/02/19 1042 5\' 8"  (1.727 m)     Head Circumference --      Peak Flow --      Pain Score 09/02/19 1044 7     Pain Loc --      Pain Edu? --      Excl. in [place]? --    No data found.  Updated Vital Signs BP (!) 133/95 (BP Location: Left Arm)   Pulse 67   Temp 98.7 F (37.1 C) (Oral)   Resp 18   Ht 5\' 8"  (1.727 m)   Wt 88.5 kg   SpO2 98%   BMI 29.65  kg/m   Visual Acuity Right Eye Distance:   Left Eye Distance:   Bilateral Distance:    Right Eye Near:   Left Eye Near:    Bilateral Near:     Physical Exam Vitals and nursing note reviewed.  Constitutional:      General: He is not in acute distress. HENT:     Head: Normocephalic.     Right Ear: External ear normal.     Left Ear: External ear normal.  Eyes:     Pupils: Pupils are equal, round, and reactive to light.  Cardiovascular:     Rate and Rhythm: Normal rate.     Heart sounds: Normal heart sounds.  Pulmonary:     Breath sounds: Normal breath sounds.  Abdominal:     Palpations: Abdomen is soft.     Tenderness: There is no abdominal tenderness.  Musculoskeletal:        General: Tenderness present.     Cervical back: Normal range of motion.       Legs:     Comments: Left posterior thigh has tenderness to palpation without swelling or ecchymosis.  Patient has pain in left posterior thigh with resisted extension of left hip, and resisted flexion of left knee.  Skin:    General: Skin is warm and dry.  Neurological:     Mental Status: He is alert.      UC Treatments / Results  Labs (all labs ordered are listed, but only abnormal results are displayed) Labs Reviewed - No data to display  EKG   Radiology No results found.  Procedures Procedures (including critical care time)  Medications Ordered in UC Medications - No data to display  Initial Impression / Assessment and Plan / UC Course  I have reviewed the triage vital signs and the nursing notes.  Pertinent labs & imaging results that were available during my care of the patient were reviewed by me and considered in my medical decision making (see chart for details).    Rx for Flexeril 10mg  TID prn. Followup with Dr. Aundria Mems ([facility]) if not improving about two weeks.    Final Clinical Impressions(s) / UC Diagnoses   Final diagnoses:  Hamstring strain, left, initial  encounter     Discharge Instructions  1. Apply ice to the injured area: ? Put ice in a plastic bag. ? Place a towel between your skin and the bag. ? Leave the ice on for 20 minutes, 2-3 times a day. After the third day, may switch to applying heat. May take Ibuprofen 200mg , 4 tabs every 8 hours with food.  Use a cane for support.  Begin range of motion and stretching exercises as tolerated.    ED Prescriptions    Medication Sig Dispense Auth. Provider   cyclobenzaprine (FLEXERIL) 10 MG tablet Take 1 tablet (10 mg total) by mouth 3 (three) times daily as needed for muscle spasms. 20 tablet , MD        Lattie Haw, MD 09/02/19 [phone]

## 2019-09-02 NOTE — Discharge Instructions (Addendum)
Apply ice to the injured area: Put ice in a plastic bag. Place a towel between your skin and the bag. Leave the ice on for 20 minutes, 2-3 times a day. After the third day, may switch to applying heat. May take Ibuprofen 200mg , 4 tabs every 8 hours with food.  Use a cane for support.  Begin range of motion and stretching exercises as tolerated.

## 2019-09-02 NOTE — ED Triage Notes (Addendum)
C/o pain to post/lateral  left thigh - walking up steps at 2130 last night and felt immediate pain- denies injury  No OTC meds or ice Pt states he can barely walk & can not work today Describes pain as shooting No radiation to hip or back [employer]  Vaccine April 7th

## 2019-11-23 DIAGNOSIS — M67432 Ganglion, left wrist: Secondary | ICD-10-CM | POA: Diagnosis not present

## 2019-12-23 DIAGNOSIS — N400 Enlarged prostate without lower urinary tract symptoms: Secondary | ICD-10-CM | POA: Diagnosis not present

## 2019-12-23 DIAGNOSIS — Z13220 Encounter for screening for lipoid disorders: Secondary | ICD-10-CM | POA: Diagnosis not present

## 2019-12-23 DIAGNOSIS — R635 Abnormal weight gain: Secondary | ICD-10-CM | POA: Diagnosis not present

## 2019-12-23 DIAGNOSIS — I471 Supraventricular tachycardia: Secondary | ICD-10-CM | POA: Diagnosis not present

## 2019-12-23 DIAGNOSIS — R7989 Other specified abnormal findings of blood chemistry: Secondary | ICD-10-CM | POA: Diagnosis not present

## 2019-12-23 DIAGNOSIS — Z0000 Encounter for general adult medical examination without abnormal findings: Secondary | ICD-10-CM | POA: Diagnosis not present

## 2020-01-03 ENCOUNTER — Ambulatory Visit (INDEPENDENT_AMBULATORY_CARE_PROVIDER_SITE_OTHER): Payer: BC Managed Care – PPO | Admitting: Sports Medicine

## 2020-01-03 ENCOUNTER — Ambulatory Visit (INDEPENDENT_AMBULATORY_CARE_PROVIDER_SITE_OTHER): Payer: BC Managed Care – PPO

## 2020-01-03 ENCOUNTER — Encounter: Payer: Self-pay | Admitting: Sports Medicine

## 2020-01-03 ENCOUNTER — Other Ambulatory Visit: Payer: Self-pay

## 2020-01-03 DIAGNOSIS — M545 Low back pain, unspecified: Secondary | ICD-10-CM | POA: Insufficient documentation

## 2020-01-03 DIAGNOSIS — G8929 Other chronic pain: Secondary | ICD-10-CM

## 2020-01-03 MED ORDER — MELOXICAM 15 MG PO TABS: ORAL_TABLET | ORAL | 3 refills | Status: DC

## 2020-01-03 MED ORDER — PREDNISONE 50 MG PO TABS
ORAL_TABLET | ORAL | 0 refills | Status: DC
Start: 2020-01-03 — End: 2020-10-05

## 2020-01-03 NOTE — Progress Notes (Signed)
    Procedures performed today:    None.  Independent interpretation of notes and tests performed by another provider:   None.  Brief History, Exam, Impression, and Recommendations:    Becker is a pleasant 49yo male who presents today with low back pain and hip pain. He is having pain in his midline low back without any numbness or tingling down her leg. The pain began this summer and has worsened. He has no pain on palpation of the spine. The pain is worse with twisting and sometimes more with cough. I do believe that he has some degenerative disk disease of his lumbar spine. We are going to get Xrays today. We are also going to prescribe a 5 day prednisone burst to reduce his inflammation and then he can start meloxicam once he finishes prednisone. He is also going to start PT. He is going to follow up in 4-6 weeks for reevalaution and possible MRI if no better.   Aurelio Jew, MS3   ___________________________________________ Ihor Austin. Benjamin Stain, M.D., ABFM., CAQSM. Primary Care and Sports Medicine [facility] [employer]  Adjunct Instructor of Family Medicine  [employer]

## 2020-01-03 NOTE — Assessment & Plan Note (Signed)
This is a pleasant 49 year old male, he does play golf, lately has had some pain in his back, midline low back, nothing radicular, no red flag symptoms. Unfortunately he has had severe worsening in his pain, worse with twisting, golfing, on exam he does not really have any discrete areas of tenderness, negative stork test bilaterally. He does endorse some pain with Valsalva. I do think he has some disc disease, certainly spondylolisthesis/spondylolysis are possibilities with axial loading of golf. We will start conservatively with 5 days of prednisone, meloxicam, x-rays, formal PT. Return to see me in 6 weeks, MRI if no better.

## 2020-01-08 DIAGNOSIS — Z87891 Personal history of nicotine dependence: Secondary | ICD-10-CM | POA: Diagnosis not present

## 2020-01-08 DIAGNOSIS — S91352A Open bite, left foot, initial encounter: Secondary | ICD-10-CM | POA: Diagnosis not present

## 2020-01-08 DIAGNOSIS — W5911XA Bitten by nonvenomous snake, initial encounter: Secondary | ICD-10-CM | POA: Diagnosis not present

## 2020-01-08 DIAGNOSIS — Z23 Encounter for immunization: Secondary | ICD-10-CM | POA: Diagnosis not present

## 2020-01-08 DIAGNOSIS — S90872A Other superficial bite of left foot, initial encounter: Secondary | ICD-10-CM | POA: Diagnosis not present

## 2020-01-08 DIAGNOSIS — Z79899 Other long term (current) drug therapy: Secondary | ICD-10-CM | POA: Diagnosis not present

## 2020-01-09 ENCOUNTER — Ambulatory Visit: Payer: BC Managed Care – PPO | Admitting: Rehabilitative and Restorative Service Providers"

## 2020-01-16 ENCOUNTER — Ambulatory Visit (INDEPENDENT_AMBULATORY_CARE_PROVIDER_SITE_OTHER): Payer: BC Managed Care – PPO | Admitting: Rehabilitative and Restorative Service Providers"

## 2020-01-16 ENCOUNTER — Encounter: Payer: Self-pay | Admitting: Rehabilitative and Restorative Service Providers"

## 2020-01-16 ENCOUNTER — Other Ambulatory Visit: Payer: Self-pay

## 2020-01-16 DIAGNOSIS — G8929 Other chronic pain: Secondary | ICD-10-CM | POA: Diagnosis not present

## 2020-01-16 DIAGNOSIS — R29898 Other symptoms and signs involving the musculoskeletal system: Secondary | ICD-10-CM

## 2020-01-16 DIAGNOSIS — M545 Low back pain, unspecified: Secondary | ICD-10-CM
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
## 2020-01-16 NOTE — Patient Instructions (Addendum)
Access Code: [email or web address]: [email or web address]: 10/04/2021Prepared by: Jakaylee Sasaki HoltExercises  Prone Press Up - 2 x daily - 7 x weekly - 1 sets - 10 reps - 2-3 sec hold  Prone Quadriceps Stretch with Strap - 2 x daily - 7 x weekly - 1 sets - 3 reps - 30 sec hold  Hooklying Hamstring Stretch with Strap - 2 x daily - 7 x weekly - 1 sets - 3 reps - 30 sec hold  Supine ITB Stretch with Strap - 2 x daily - 7 x weekly - 1 sets - 3 reps - 30 sec hold  Supine Piriformis Stretch with Leg Straight - 2 x daily - 7 x weekly - 1 sets - 3 reps - 30 sec hold Patient Education  TENS Unit  Posture and Body Mechanics  Office Posture

## 2020-01-19 ENCOUNTER — Other Ambulatory Visit: Payer: Self-pay

## 2020-01-19 ENCOUNTER — Encounter: Payer: Self-pay | Admitting: Physical Therapy

## 2020-01-19 ENCOUNTER — Ambulatory Visit (INDEPENDENT_AMBULATORY_CARE_PROVIDER_SITE_OTHER): Payer: BC Managed Care – PPO | Admitting: Physical Therapy

## 2020-01-19 DIAGNOSIS — M545 Low back pain, unspecified: Secondary | ICD-10-CM

## 2020-01-19 DIAGNOSIS — G8929 Other chronic pain: Secondary | ICD-10-CM | POA: Diagnosis not present

## 2020-01-19 DIAGNOSIS — R29898 Other symptoms and signs involving the musculoskeletal system: Secondary | ICD-10-CM

## 2020-01-19 NOTE — Therapy (Signed)
[facility] [place] [address] [place], [place], [address] Phone: [phone]   Fax:  [phone]  Physical Therapy Treatment  Patient Details  Name: George Nelson MRN: [id number] Date of Birth: [birth year] Referring Provider (PT): Dr Benjamin Stain   Encounter Date: 01/19/2020   PT End of Session - 01/19/20 1405    Visit Number 2    Number of Visits 12    Date for PT Re-Evaluation 02/27/20    PT Start Time 1404    PT Stop Time 1444    PT Time Calculation (min) 40 min    Activity Tolerance Patient tolerated treatment well    Behavior During Therapy [facility] for tasks assessed/performed           Past Medical History:  Diagnosis Date  . SVT (supraventricular tachycardia) (HCC)     Past Surgical History:  Procedure Laterality Date  . ANKLE SURGERY    . CARDIAC SURGERY  2006   Ablation   . HAND SURGERY    . KNEE SURGERY      There were no vitals filed for this visit.   Subjective Assessment - 01/19/20 1408    Subjective Pt reports his back/hips have felt better since eval.  He hasn't done any of the exercises.  He is planning to try golfing this weekend.    Patient Stated Goals to get rid of back pain    Currently in Pain? No/denies    Pain Score 0-No pain              [facility] PT Assessment - 01/19/20 0001      Assessment   Medical Diagnosis Low back pain Lt > Rt     Referring Provider (PT) Dr Benjamin Stain    Onset Date/Surgical Date 01/13/19    Hand Dominance Right    Next MD Visit 02/14/20    Prior Therapy none             [facility] Adult PT Treatment/Exercise - 01/19/20 0001      Self-Care   Self-Care Other Self-Care Comments    Other Self-Care Comments  Pt educated in self massage with ball to hip/lumbar musculature; pt returned demo with cues.        Lumbar Exercises: Stretches   Passive Hamstring Stretch Right;Left;3 reps;20 seconds   seated with varied foot position   Single Knee to Chest Stretch  Right;Left;1 rep;10 seconds    Lower Trunk Rotation 10 seconds;4 reps   arms in T and Y.    Hip Flexor Stretch Right;Left;2 reps;30 seconds   sitting    Standing Extension 3 reps;5 seconds    Piriformis Stretch Left;Right;20 seconds;2 reps   seated   Figure 4 Stretch 1 rep;20 seconds      Lumbar Exercises: Aerobic   Nustep L5: legs only x 5 min       Lumbar Exercises: Sidelying   Other Sidelying Lumbar Exercises open book x 4 reps each side.           Standing simulated slow golf swings x 4 - no pain.      PT Long Term Goals - 01/16/20 1321      PT LONG TERM GOAL #1   Title Improve lumbar mobility with patient to demonstrate lumbar AROM WFL's and pain free.    Time 6    Period Weeks    Status New    Target Date 02/27/20      PT LONG TERM GOAL #2   Title  Patient reports 75=100% imporovement in pain in the LB and hips    Time 6    Period Weeks    Status New    Target Date 02/27/20      PT LONG TERM GOAL #3   Title Patient demonstrates and verbalizes proper sitting; lifting; transitioinal movements    Time 6    Period Weeks    Status New    Target Date 02/27/20      PT LONG TERM GOAL #4   Title Independent in HEP    Time 6    Period Weeks    Status New    Target Date 02/27/20      PT LONG TERM GOAL #5   Title Improve FOTO to </=38% limitation    Time 6    Period Weeks    Status New    Target Date 02/27/20                 Plan - 01/19/20 1414    Clinical Impression Statement Pt reporting reduction of pain in low back 80+% since last visit. Pt shown seated stretches as alternative to improve compliance.  Pt tolerated stretches well, without increased pain.  Progressing well towards goals.    Stability/Clinical Decision Making Stable/Uncomplicated    Rehab Potential Good    PT Frequency 2x / week    PT Duration 6 weeks    PT Treatment/Interventions ADLs/Self Care Home Management;Cryotherapy;Electrical Stimulation;Iontophoresis 4mg /ml  Dexamethasone;Moist Heat;Ultrasound;Gait training;Stair training;Functional mobility training;Therapeutic activities;Therapeutic exercise;Balance training;Neuromuscular re-education;Patient/family education;Manual techniques;Dry needling;Taping    PT Next Visit Plan progress with core stabilization and strengthening; hinged hip with core strengthening; functional strengthening.    PT Home Exercise Plan    Consulted and Agree with Plan of Care Patient           Patient will benefit from skilled therapeutic intervention in order to improve the following deficits and impairments:  Abnormal gait, Decreased range of motion, Increased fascial restricitons, Pain, Hypomobility, Impaired flexibility, Improper body mechanics, Decreased mobility, Decreased strength, Postural dysfunction  Visit Diagnosis: Chronic bilateral low back pain without sciatica  Other symptoms and signs involving the musculoskeletal system     Problem List Patient Active Problem List   Diagnosis Date Noted  . Chronic low back pain 01/03/2020  . Right foot pain 03/31/2016  . Greater trochanteric bursitis, left 03/03/2016  . AC separation, type 2 06/18/2015  . Right cervical radiculopathy 06/18/2015   08/18/2015, PTA 01/19/20 2:49 PM  [facility] [place] [address] [place], [place], [place] Phone: [phone]   Fax:  [phone]  Name: George Nelson MRN: Michel Santee Date of Birth: 05/14/1970

## 2020-01-24 ENCOUNTER — Encounter: Payer: BC Managed Care – PPO | Admitting: Physical Therapy

## 2020-01-27 ENCOUNTER — Encounter: Payer: BC Managed Care – PPO | Admitting: Physical Therapy

## 2020-01-30 ENCOUNTER — Encounter: Payer: BC Managed Care – PPO | Admitting: Physical Therapy

## 2020-02-02 ENCOUNTER — Encounter: Payer: BC Managed Care – PPO | Admitting: Physical Therapy

## 2020-02-14 ENCOUNTER — Ambulatory Visit (INDEPENDENT_AMBULATORY_CARE_PROVIDER_SITE_OTHER): Payer: BC Managed Care – PPO | Admitting: Sports Medicine

## 2020-02-14 ENCOUNTER — Other Ambulatory Visit: Payer: Self-pay

## 2020-02-14 DIAGNOSIS — M545 Low back pain, unspecified: Secondary | ICD-10-CM | POA: Diagnosis not present

## 2020-02-14 DIAGNOSIS — G8929 Other chronic pain: Secondary | ICD-10-CM | POA: Diagnosis not present

## 2020-02-14 NOTE — Assessment & Plan Note (Signed)
This is a pleasant 50 year old male golfer, he has been having some pain in his back, midline, nothing radicular, no red flags, we put him through some physical therapy, meloxicam, prednisone and he has improved significantly. He has not yet played golf so he will try the driving range this weekend, if persistent discomfort we will proceed with MRI and epidural.

## 2020-02-14 NOTE — Progress Notes (Signed)
    Procedures performed today:    None.  Independent interpretation of notes and tests performed by another provider:   None.  Brief History, Exam, Impression, and Recommendations:    Chronic low back pain This is a pleasant 49 year old male golfer, he has been having some pain in his back, midline, nothing radicular, no red flags, we put him through some physical therapy, meloxicam, prednisone and he has improved significantly. He has not yet played golf so he will try the driving range this weekend, if persistent discomfort we will proceed with MRI and epidural.    ___________________________________________ Ihor Austin. Benjamin Stain, M.D., ABFM., CAQSM. Primary Care and Sports Medicine [place] [facility]  Adjunct Instructor of Family Medicine  [employer]

## 2020-03-15 DIAGNOSIS — Z23 Encounter for immunization: Secondary | ICD-10-CM | POA: Diagnosis not present

## 2020-03-23 DIAGNOSIS — I10 Essential (primary) hypertension: Secondary | ICD-10-CM | POA: Diagnosis not present

## 2020-03-23 DIAGNOSIS — K642 Third degree hemorrhoids: Secondary | ICD-10-CM | POA: Diagnosis not present

## 2020-03-23 DIAGNOSIS — M47812 Spondylosis without myelopathy or radiculopathy, cervical region: Secondary | ICD-10-CM | POA: Diagnosis not present

## 2020-03-23 DIAGNOSIS — M545 Low back pain, unspecified: Secondary | ICD-10-CM | POA: Diagnosis not present

## 2020-04-04 DIAGNOSIS — K649 Unspecified hemorrhoids: Secondary | ICD-10-CM

## 2020-04-04 DIAGNOSIS — K625 Hemorrhage of anus and rectum: Secondary | ICD-10-CM | POA: Diagnosis not present

## 2020-04-04 HISTORY — DX: Unspecified hemorrhoids: K64.9

## 2020-04-27 DIAGNOSIS — Z1211 Encounter for screening for malignant neoplasm of colon: Secondary | ICD-10-CM | POA: Diagnosis not present

## 2020-04-27 DIAGNOSIS — K625 Hemorrhage of anus and rectum: Secondary | ICD-10-CM | POA: Diagnosis not present

## 2020-04-30 ENCOUNTER — Other Ambulatory Visit: Payer: Self-pay | Admitting: Sports Medicine

## 2020-04-30 DIAGNOSIS — G8929 Other chronic pain: Secondary | ICD-10-CM

## 2020-04-30 DIAGNOSIS — M545 Low back pain, unspecified: Secondary | ICD-10-CM

## 2020-04-30 MED ORDER — MELOXICAM 15 MG PO TABS: ORAL_TABLET | ORAL | 3 refills | Status: DC

## 2020-05-01 DIAGNOSIS — Z1211 Encounter for screening for malignant neoplasm of colon: Secondary | ICD-10-CM | POA: Diagnosis not present

## 2020-05-01 DIAGNOSIS — K648 Other hemorrhoids: Secondary | ICD-10-CM | POA: Diagnosis not present

## 2020-05-23 DIAGNOSIS — D492 Neoplasm of unspecified behavior of bone, soft tissue, and skin: Secondary | ICD-10-CM | POA: Diagnosis not present

## 2020-05-23 DIAGNOSIS — L821 Other seborrheic keratosis: Secondary | ICD-10-CM | POA: Diagnosis not present

## 2020-05-23 DIAGNOSIS — D2371 Other benign neoplasm of skin of right lower limb, including hip: Secondary | ICD-10-CM | POA: Diagnosis not present

## 2020-05-23 DIAGNOSIS — D239 Other benign neoplasm of skin, unspecified: Secondary | ICD-10-CM | POA: Diagnosis not present

## 2020-05-23 DIAGNOSIS — Q828 Other specified congenital malformations of skin: Secondary | ICD-10-CM | POA: Diagnosis not present

## 2020-05-23 DIAGNOSIS — D2271 Melanocytic nevi of right lower limb, including hip: Secondary | ICD-10-CM | POA: Diagnosis not present

## 2020-06-01 DIAGNOSIS — K649 Unspecified hemorrhoids: Secondary | ICD-10-CM | POA: Diagnosis not present

## 2020-08-17 DIAGNOSIS — J0110 Acute frontal sinusitis, unspecified: Secondary | ICD-10-CM

## 2020-08-17 DIAGNOSIS — R059 Cough, unspecified: Secondary | ICD-10-CM | POA: Diagnosis not present

## 2020-08-17 HISTORY — DX: Cough, unspecified: R05.9

## 2020-08-17 HISTORY — DX: Acute frontal sinusitis, unspecified: J01.10

## 2020-09-05 ENCOUNTER — Encounter (INDEPENDENT_AMBULATORY_CARE_PROVIDER_SITE_OTHER): Payer: BC Managed Care – PPO

## 2020-09-05 DIAGNOSIS — G8929 Other chronic pain: Secondary | ICD-10-CM

## 2020-09-05 DIAGNOSIS — M545 Low back pain, unspecified: Secondary | ICD-10-CM | POA: Diagnosis not present

## 2020-09-21 DIAGNOSIS — I471 Supraventricular tachycardia: Secondary | ICD-10-CM | POA: Diagnosis not present

## 2020-09-21 DIAGNOSIS — I10 Essential (primary) hypertension: Secondary | ICD-10-CM | POA: Diagnosis not present

## 2020-10-05 ENCOUNTER — Ambulatory Visit (INDEPENDENT_AMBULATORY_CARE_PROVIDER_SITE_OTHER): Payer: BC Managed Care – PPO

## 2020-10-05 ENCOUNTER — Other Ambulatory Visit: Payer: Self-pay

## 2020-10-05 ENCOUNTER — Ambulatory Visit (INDEPENDENT_AMBULATORY_CARE_PROVIDER_SITE_OTHER): Payer: BC Managed Care – PPO | Admitting: Sports Medicine

## 2020-10-05 DIAGNOSIS — M19011 Primary osteoarthritis, right shoulder: Secondary | ICD-10-CM | POA: Diagnosis not present

## 2020-10-05 DIAGNOSIS — M7541 Impingement syndrome of right shoulder: Secondary | ICD-10-CM | POA: Insufficient documentation

## 2020-10-05 MED ORDER — MELOXICAM 15 MG PO TABS
ORAL_TABLET | ORAL | 3 refills | Status: DC
Start: 2020-10-05 — End: 2021-03-29

## 2020-10-05 NOTE — Progress Notes (Signed)
    Procedures performed today:    None.  Independent interpretation of notes and tests performed by another provider:   None.  Brief History, Exam, Impression, and Recommendations:    Impingement syndrome, shoulder, right George Nelson is a pleasant 50 year old male, has been playing some volleyball and golf, has started to have pain over his right shoulder, anterior aspect and lateral, worse with overhead activities. Not waking him from sleep, on exam he has mildly positive impingement signs and a mildly positive O'Brien's test, he likely has some degree of shoulder bursitis, we discussed the pathophysiology, anatomy, we will start conservatively with meloxicam, x-rays, cuff conditioning exercises, return to see me in 4 to 6 weeks, MRI versus injection if no better.    ___________________________________________ Ihor Austin. Benjamin Stain, M.D., ABFM., CAQSM. Primary Care and Sports Medicine [place] [facility]  Adjunct Instructor of Family Medicine  [employer]

## 2020-10-05 NOTE — Assessment & Plan Note (Signed)
George Nelson is a pleasant 50 year old male, has been playing some volleyball and golf, has started to have pain over his right shoulder, anterior aspect and lateral, worse with overhead activities. Not waking him from sleep, on exam he has mildly positive impingement signs and a mildly positive O'Brien's test, he likely has some degree of shoulder bursitis, we discussed the pathophysiology, anatomy, we will start conservatively with meloxicam, x-rays, cuff conditioning exercises, return to see me in 4 to 6 weeks, MRI versus injection if no better.

## 2020-11-02 ENCOUNTER — Ambulatory Visit (INDEPENDENT_AMBULATORY_CARE_PROVIDER_SITE_OTHER): Payer: BC Managed Care – PPO | Admitting: Sports Medicine

## 2020-11-02 ENCOUNTER — Other Ambulatory Visit: Payer: Self-pay

## 2020-11-02 DIAGNOSIS — M7541 Impingement syndrome of right shoulder: Secondary | ICD-10-CM

## 2020-11-02 NOTE — Assessment & Plan Note (Signed)
George Nelson returns, he is a very pleasant 50 year old male, he was playing some volleyball and golf and started to have pain over his right shoulder, anterior aspect, worse with overhead activities, he had mildly positive impingement signs and a positive O'Brien's test, we suspected shoulder bursitis, discussed the anatomy, pathophysiology, I gave him some meloxicam, cuff conditioning exercises and he returns today pain-free. Continue conditioning, return as needed.

## 2020-11-02 NOTE — Progress Notes (Signed)
    Procedures performed today:    None.  Independent interpretation of notes and tests performed by another provider:   None.  Brief History, Exam, Impression, and Recommendations:    Impingement syndrome, shoulder, right George Nelson returns, he is a very pleasant 50 year old male, he was playing some volleyball and golf and started to have pain over his right shoulder, anterior aspect, worse with overhead activities, he had mildly positive impingement signs and a positive O'Brien's test, we suspected shoulder bursitis, discussed the anatomy, pathophysiology, I gave him some meloxicam, cuff conditioning exercises and he returns today pain-free. Continue conditioning, return as needed.    ___________________________________________ Ihor Austin. Benjamin Stain, M.D., ABFM., CAQSM. Primary Care and Sports Medicine [place] [facility]  Adjunct Instructor of Family Medicine  [employer] [facility] [facility] [employer]

## 2020-12-14 DIAGNOSIS — I471 Supraventricular tachycardia: Secondary | ICD-10-CM | POA: Diagnosis not present

## 2020-12-14 DIAGNOSIS — I10 Essential (primary) hypertension: Secondary | ICD-10-CM | POA: Diagnosis not present

## 2020-12-14 DIAGNOSIS — Z0000 Encounter for general adult medical examination without abnormal findings: Secondary | ICD-10-CM | POA: Diagnosis not present

## 2020-12-14 DIAGNOSIS — Z8679 Personal history of other diseases of the circulatory system: Secondary | ICD-10-CM | POA: Diagnosis not present

## 2021-01-04 DIAGNOSIS — L814 Other melanin hyperpigmentation: Secondary | ICD-10-CM | POA: Diagnosis not present

## 2021-01-04 DIAGNOSIS — L603 Nail dystrophy: Secondary | ICD-10-CM | POA: Diagnosis not present

## 2021-01-04 DIAGNOSIS — I10 Essential (primary) hypertension: Secondary | ICD-10-CM | POA: Diagnosis not present

## 2021-01-04 DIAGNOSIS — L821 Other seborrheic keratosis: Secondary | ICD-10-CM | POA: Diagnosis not present

## 2021-02-07 DIAGNOSIS — I10 Essential (primary) hypertension: Secondary | ICD-10-CM | POA: Diagnosis not present

## 2021-02-07 DIAGNOSIS — J0191 Acute recurrent sinusitis, unspecified: Secondary | ICD-10-CM | POA: Diagnosis not present

## 2021-03-14 DIAGNOSIS — I10 Essential (primary) hypertension: Secondary | ICD-10-CM | POA: Diagnosis not present

## 2021-03-29 ENCOUNTER — Other Ambulatory Visit: Payer: Self-pay

## 2021-03-29 DIAGNOSIS — M7541 Impingement syndrome of right shoulder: Secondary | ICD-10-CM

## 2021-03-29 MED ORDER — MELOXICAM 15 MG PO TABS
ORAL_TABLET | ORAL | 3 refills | Status: DC
Start: 2021-03-29 — End: 2023-02-16

## 2021-04-19 DIAGNOSIS — N400 Enlarged prostate without lower urinary tract symptoms: Secondary | ICD-10-CM | POA: Diagnosis not present

## 2021-04-19 DIAGNOSIS — I471 Supraventricular tachycardia: Secondary | ICD-10-CM | POA: Diagnosis not present

## 2021-04-19 DIAGNOSIS — R634 Abnormal weight loss: Secondary | ICD-10-CM | POA: Diagnosis not present

## 2021-04-19 DIAGNOSIS — Z0000 Encounter for general adult medical examination without abnormal findings: Secondary | ICD-10-CM | POA: Diagnosis not present

## 2021-04-19 DIAGNOSIS — E559 Vitamin D deficiency, unspecified: Secondary | ICD-10-CM | POA: Diagnosis not present

## 2021-04-19 DIAGNOSIS — E785 Hyperlipidemia, unspecified: Secondary | ICD-10-CM | POA: Diagnosis not present

## 2021-04-19 DIAGNOSIS — E349 Endocrine disorder, unspecified: Secondary | ICD-10-CM | POA: Diagnosis not present

## 2021-05-24 DIAGNOSIS — D2260 Melanocytic nevi of unspecified upper limb, including shoulder: Secondary | ICD-10-CM | POA: Diagnosis not present

## 2021-05-24 DIAGNOSIS — D2270 Melanocytic nevi of unspecified lower limb, including hip: Secondary | ICD-10-CM | POA: Diagnosis not present

## 2021-05-24 DIAGNOSIS — L821 Other seborrheic keratosis: Secondary | ICD-10-CM | POA: Diagnosis not present

## 2021-05-24 DIAGNOSIS — L309 Dermatitis, unspecified: Secondary | ICD-10-CM | POA: Diagnosis not present

## 2021-05-24 DIAGNOSIS — D225 Melanocytic nevi of trunk: Secondary | ICD-10-CM | POA: Diagnosis not present

## 2021-09-27 DIAGNOSIS — J209 Acute bronchitis, unspecified: Secondary | ICD-10-CM | POA: Diagnosis not present

## 2022-01-14 IMAGING — DX DG SHOULDER 2+V*R*
3 series · 3 of 3 positions shown · non-contrast
Comparison: None.

CLINICAL DATA: Pain.

EXAM:
RIGHT SHOULDER - 2+ VIEW

[shoulder grashey]
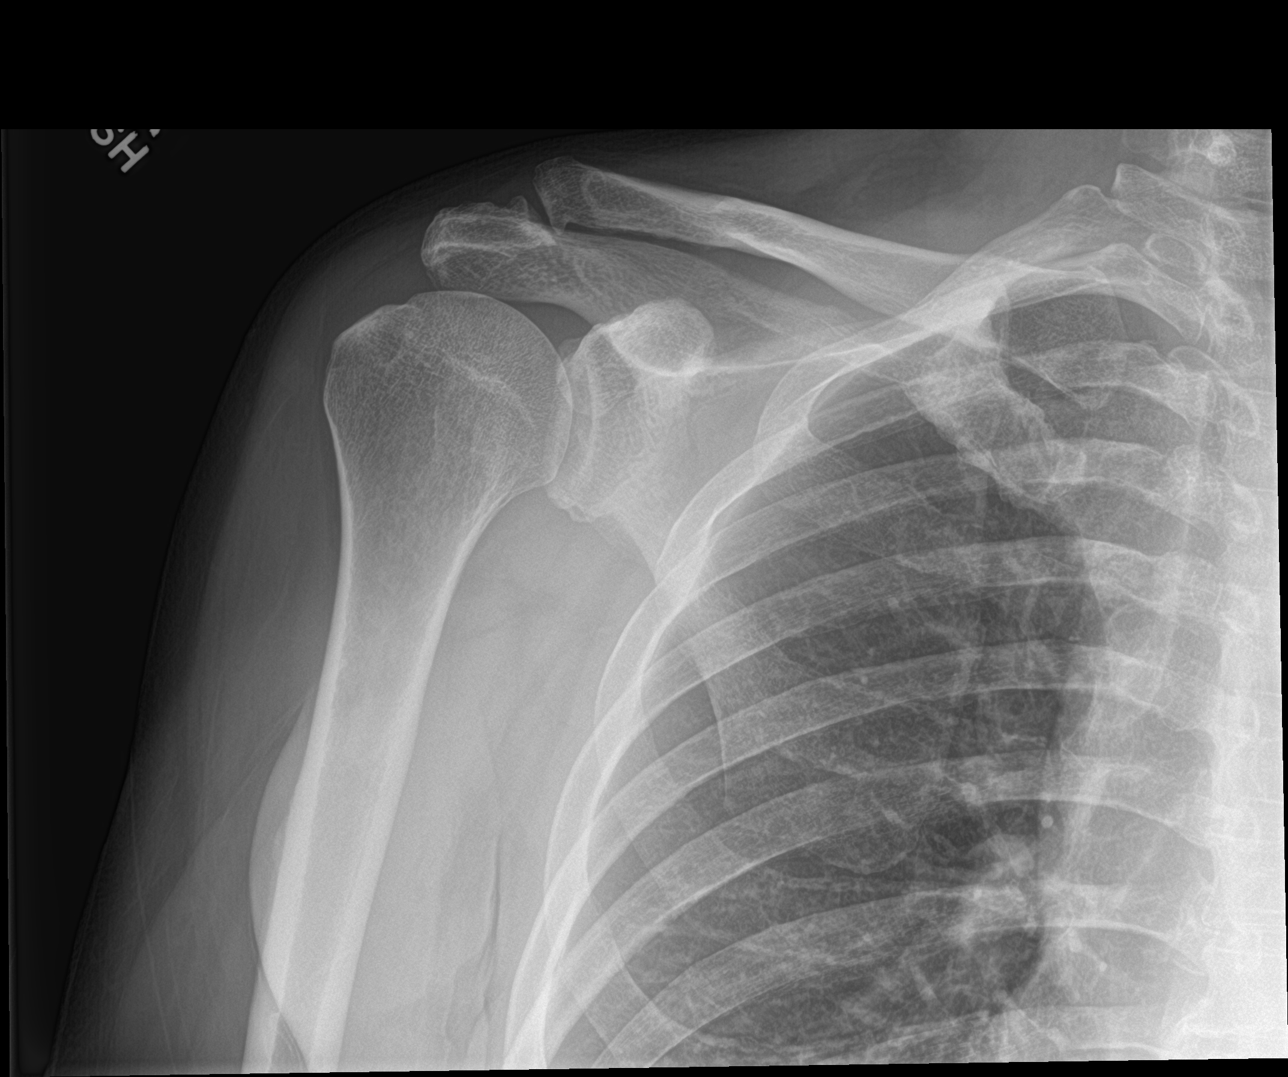

[shoulder y view]
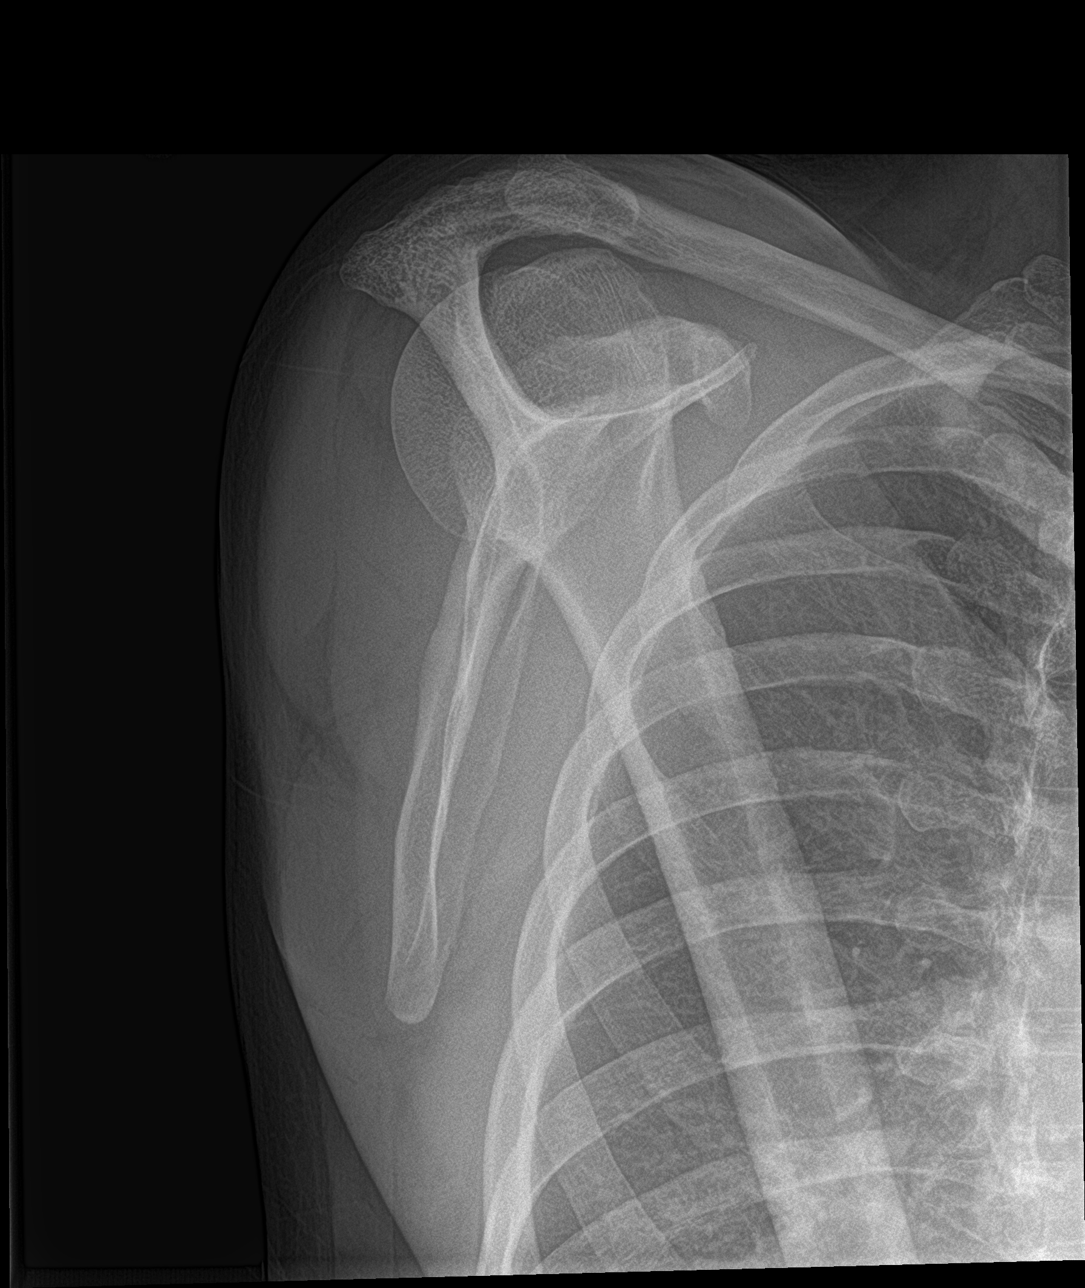

[shoulder axillary]
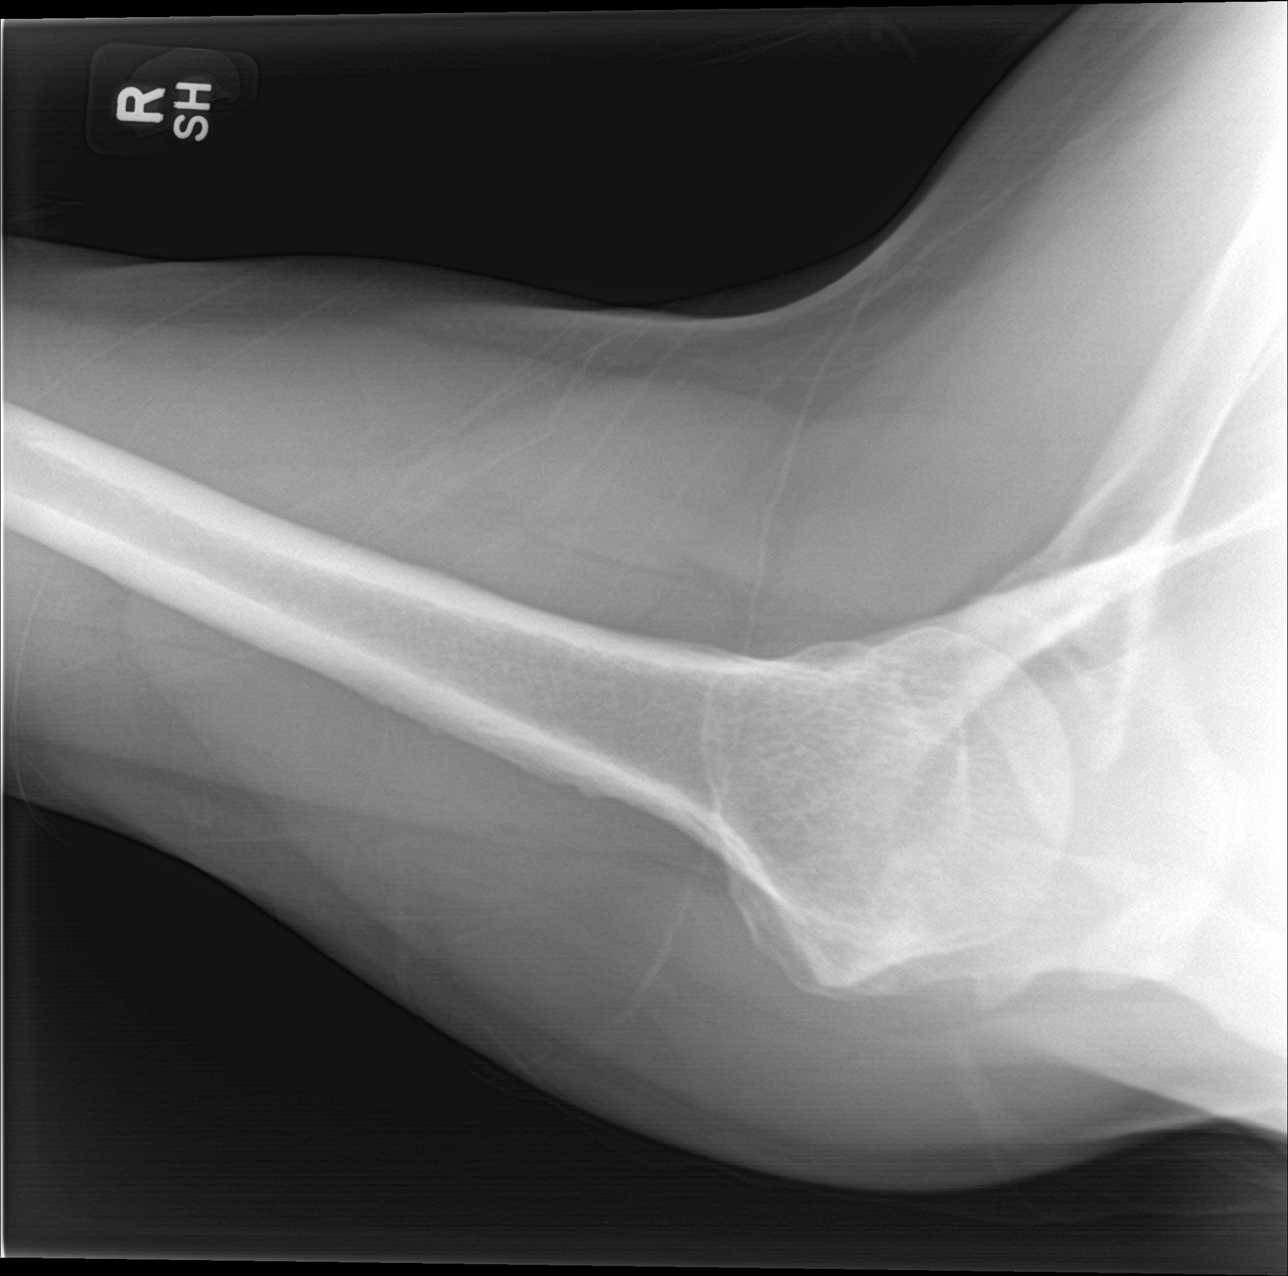

[3 of 3 positions shown; findings below may reference images not displayed]

FINDINGS: No fracture or dislocation. Mild AC joint degenerative changes. No
other abnormalities.
IMPRESSION: Mild AC joint degenerative changes.  No other abnormalities.

## 2022-03-05 DIAGNOSIS — I10 Essential (primary) hypertension: Secondary | ICD-10-CM | POA: Diagnosis not present

## 2022-03-05 DIAGNOSIS — Z13220 Encounter for screening for lipoid disorders: Secondary | ICD-10-CM | POA: Diagnosis not present

## 2022-03-05 DIAGNOSIS — M7989 Other specified soft tissue disorders: Secondary | ICD-10-CM | POA: Diagnosis not present

## 2022-03-05 DIAGNOSIS — H00014 Hordeolum externum left upper eyelid: Secondary | ICD-10-CM | POA: Diagnosis not present

## 2022-03-05 DIAGNOSIS — R079 Chest pain, unspecified: Secondary | ICD-10-CM | POA: Diagnosis not present

## 2022-03-05 DIAGNOSIS — R053 Chronic cough: Secondary | ICD-10-CM | POA: Diagnosis not present

## 2022-03-25 DIAGNOSIS — L723 Sebaceous cyst: Secondary | ICD-10-CM | POA: Diagnosis not present

## 2022-04-11 DIAGNOSIS — H02846 Edema of left eye, unspecified eyelid: Secondary | ICD-10-CM | POA: Diagnosis not present

## 2022-04-17 DIAGNOSIS — H02826 Cysts of left eye, unspecified eyelid: Secondary | ICD-10-CM | POA: Diagnosis not present

## 2022-04-17 DIAGNOSIS — F330 Major depressive disorder, recurrent, mild: Secondary | ICD-10-CM | POA: Diagnosis not present

## 2022-04-17 DIAGNOSIS — F411 Generalized anxiety disorder: Secondary | ICD-10-CM | POA: Diagnosis not present

## 2022-05-06 DIAGNOSIS — I10 Essential (primary) hypertension: Secondary | ICD-10-CM | POA: Diagnosis not present

## 2022-05-06 DIAGNOSIS — N400 Enlarged prostate without lower urinary tract symptoms: Secondary | ICD-10-CM | POA: Diagnosis not present

## 2022-05-06 DIAGNOSIS — F411 Generalized anxiety disorder: Secondary | ICD-10-CM | POA: Diagnosis not present

## 2022-05-06 DIAGNOSIS — F330 Major depressive disorder, recurrent, mild: Secondary | ICD-10-CM | POA: Diagnosis not present

## 2022-05-06 DIAGNOSIS — Z0000 Encounter for general adult medical examination without abnormal findings: Secondary | ICD-10-CM | POA: Diagnosis not present

## 2022-05-23 ENCOUNTER — Ambulatory Visit (INDEPENDENT_AMBULATORY_CARE_PROVIDER_SITE_OTHER): Payer: BC Managed Care – PPO | Admitting: Sports Medicine

## 2022-05-23 ENCOUNTER — Ambulatory Visit (INDEPENDENT_AMBULATORY_CARE_PROVIDER_SITE_OTHER): Payer: BC Managed Care – PPO

## 2022-05-23 ENCOUNTER — Telehealth: Payer: Self-pay | Admitting: Sports Medicine

## 2022-05-23 DIAGNOSIS — M7062 Trochanteric bursitis, left hip: Secondary | ICD-10-CM

## 2022-05-23 DIAGNOSIS — M25552 Pain in left hip: Secondary | ICD-10-CM | POA: Diagnosis not present

## 2022-05-23 DIAGNOSIS — M160 Bilateral primary osteoarthritis of hip: Secondary | ICD-10-CM | POA: Diagnosis not present

## 2022-05-23 MED ORDER — PREDNISONE 50 MG PO TABS: ORAL_TABLET | ORAL | 0 refills | Status: DC

## 2022-05-23 MED ORDER — TRAMADOL HCL 50 MG PO TABS: 50.0000 mg | ORAL_TABLET | Freq: Three times a day (TID) | ORAL | 0 refills | Status: DC | PRN

## 2022-05-23 NOTE — Telephone Encounter (Signed)
Sent!

## 2022-05-23 NOTE — Progress Notes (Signed)
    Procedures performed today:    None.  Independent interpretation of notes and tests performed by another provider:   None.  Brief History, Exam, Impression, and Recommendations:    Greater trochanteric bursitis, left Pleasant 52 year old male, we have treated him for greater trochanteric bursitis back in 2017 with an injection and he did well. He has been doing a lot of moving and is having increasing pain left lateral hip with occasional radiation to the front, exam is completely benign today, no tenderness at the greater trochanter, no pain with resisted hip abduction, negative FABER and FADIR signs, no pain with internal rotation. We will treat conservatively, prednisone, tramadol for breakthrough pain, home physical therapy and x-rays. Return to see me in 4 to 6 weeks.    ____________________________________________ Gwen Her. Dianah Field, M.D., ABFM., CAQSM., AME. Primary Care and Sports Medicine [place] [facility]  Adjunct Professor of [place] [employer] [facility] [employer] Medicine  Risk manager

## 2022-05-23 NOTE — Telephone Encounter (Signed)
Patient called he would like a new prescription on Tramadol 19m sent to [facility] [address] [place] [id number] forgot to update when he was here today.

## 2022-05-23 NOTE — Assessment & Plan Note (Signed)
Pleasant 52 year old male, we have treated him for greater trochanteric bursitis back in 2017 with an injection and he did well. He has been doing a lot of moving and is having increasing pain left lateral hip with occasional radiation to the front, exam is completely benign today, no tenderness at the greater trochanter, no pain with resisted hip abduction, negative FABER and FADIR signs, no pain with internal rotation. We will treat conservatively, prednisone, tramadol for breakthrough pain, home physical therapy and x-rays. Return to see me in 4 to 6 weeks.

## 2022-05-27 ENCOUNTER — Encounter: Payer: Self-pay | Admitting: Sports Medicine

## 2022-06-20 ENCOUNTER — Ambulatory Visit (INDEPENDENT_AMBULATORY_CARE_PROVIDER_SITE_OTHER): Payer: BC Managed Care – PPO | Admitting: Sports Medicine

## 2022-06-20 ENCOUNTER — Encounter: Payer: Self-pay | Admitting: Sports Medicine

## 2022-06-20 DIAGNOSIS — M7062 Trochanteric bursitis, left hip: Secondary | ICD-10-CM | POA: Diagnosis not present

## 2022-06-20 NOTE — Progress Notes (Signed)
    Procedures performed today:    None.  Independent interpretation of notes and tests performed by another provider:   None.  Brief History, Exam, Impression, and Recommendations:    Greater trochanteric bursitis, left This pleasant 52 year old male returns, we have been treating him for greater trochanteric bursitis, he had an injection back in 2017 and did well, more recently he was doing a lot of moving, started to have increasing pain at greater trochanter. We treated him with prednisone and tramadol, home physical therapy, he has done really well and essentially has no pain, he has finished moving which may have contributed as well.  Return to see me for this. I am going to send him some hip and cervical spine conditioning as a PDF to refer to in the future.    ____________________________________________ Gwen Her. Dianah Field, M.D., ABFM., CAQSM., AME. Primary Care and Sports Medicine [place] [facility]  Adjunct Professor of [place] [employer] [facility] [employer] Medicine  Risk manager

## 2022-06-20 NOTE — Assessment & Plan Note (Signed)
This pleasant 52 year old male returns, we have been treating him for greater trochanteric bursitis, he had an injection back in 2017 and did well, more recently he was doing a lot of moving, started to have increasing pain at greater trochanter. We treated him with prednisone and tramadol, home physical therapy, he has done really well and essentially has no pain, he has finished moving which may have contributed as well.  Return to see me for this. I am going to send him some hip and cervical spine conditioning as a PDF to refer to in the future.

## 2022-09-06 DIAGNOSIS — T63481A Toxic effect of venom of other arthropod, accidental (unintentional), initial encounter: Secondary | ICD-10-CM | POA: Diagnosis not present

## 2022-09-12 DIAGNOSIS — R21 Rash and other nonspecific skin eruption: Secondary | ICD-10-CM | POA: Diagnosis not present

## 2022-09-12 DIAGNOSIS — W57XXXD Bitten or stung by nonvenomous insect and other nonvenomous arthropods, subsequent encounter: Secondary | ICD-10-CM | POA: Diagnosis not present

## 2022-09-22 DIAGNOSIS — D2270 Melanocytic nevi of unspecified lower limb, including hip: Secondary | ICD-10-CM | POA: Diagnosis not present

## 2022-09-22 DIAGNOSIS — L649 Androgenic alopecia, unspecified: Secondary | ICD-10-CM | POA: Diagnosis not present

## 2022-09-22 DIAGNOSIS — D2260 Melanocytic nevi of unspecified upper limb, including shoulder: Secondary | ICD-10-CM | POA: Diagnosis not present

## 2022-09-22 DIAGNOSIS — D225 Melanocytic nevi of trunk: Secondary | ICD-10-CM | POA: Diagnosis not present

## 2022-09-22 DIAGNOSIS — W57XXXA Bitten or stung by nonvenomous insect and other nonvenomous arthropods, initial encounter: Secondary | ICD-10-CM | POA: Diagnosis not present

## 2022-09-22 DIAGNOSIS — I10 Essential (primary) hypertension: Secondary | ICD-10-CM | POA: Diagnosis not present

## 2022-09-22 DIAGNOSIS — L719 Rosacea, unspecified: Secondary | ICD-10-CM | POA: Diagnosis not present

## 2022-11-14 DIAGNOSIS — I10 Essential (primary) hypertension: Secondary | ICD-10-CM | POA: Diagnosis not present

## 2022-11-14 DIAGNOSIS — G4700 Insomnia, unspecified: Secondary | ICD-10-CM | POA: Diagnosis not present

## 2022-11-14 DIAGNOSIS — Z5181 Encounter for therapeutic drug level monitoring: Secondary | ICD-10-CM | POA: Diagnosis not present

## 2022-11-14 DIAGNOSIS — F17211 Nicotine dependence, cigarettes, in remission: Secondary | ICD-10-CM | POA: Diagnosis not present

## 2022-11-14 DIAGNOSIS — F411 Generalized anxiety disorder: Secondary | ICD-10-CM | POA: Diagnosis not present

## 2023-01-28 DIAGNOSIS — W19XXXA Unspecified fall, initial encounter: Secondary | ICD-10-CM | POA: Diagnosis not present

## 2023-01-28 DIAGNOSIS — R109 Unspecified abdominal pain: Secondary | ICD-10-CM | POA: Diagnosis not present

## 2023-01-28 DIAGNOSIS — W109XXA Fall (on) (from) unspecified stairs and steps, initial encounter: Secondary | ICD-10-CM | POA: Diagnosis not present

## 2023-01-28 DIAGNOSIS — S32009A Unspecified fracture of unspecified lumbar vertebra, initial encounter for closed fracture: Secondary | ICD-10-CM | POA: Diagnosis not present

## 2023-01-28 DIAGNOSIS — K429 Umbilical hernia without obstruction or gangrene: Secondary | ICD-10-CM | POA: Diagnosis not present

## 2023-01-28 DIAGNOSIS — M545 Low back pain, unspecified: Secondary | ICD-10-CM | POA: Diagnosis not present

## 2023-01-29 ENCOUNTER — Encounter (INDEPENDENT_AMBULATORY_CARE_PROVIDER_SITE_OTHER): Payer: BC Managed Care – PPO | Admitting: Sports Medicine

## 2023-01-29 DIAGNOSIS — M1612 Unilateral primary osteoarthritis, left hip: Secondary | ICD-10-CM

## 2023-02-04 DIAGNOSIS — S32009A Unspecified fracture of unspecified lumbar vertebra, initial encounter for closed fracture: Secondary | ICD-10-CM | POA: Diagnosis not present

## 2023-02-09 ENCOUNTER — Ambulatory Visit: Payer: BC Managed Care – PPO | Admitting: Sports Medicine

## 2023-02-16 ENCOUNTER — Ambulatory Visit (INDEPENDENT_AMBULATORY_CARE_PROVIDER_SITE_OTHER): Payer: BC Managed Care – PPO | Admitting: Sports Medicine

## 2023-02-16 ENCOUNTER — Encounter: Payer: Self-pay | Admitting: Sports Medicine

## 2023-02-16 ENCOUNTER — Ambulatory Visit: Payer: BC Managed Care – PPO

## 2023-02-16 DIAGNOSIS — M47816 Spondylosis without myelopathy or radiculopathy, lumbar region: Secondary | ICD-10-CM | POA: Diagnosis not present

## 2023-02-16 DIAGNOSIS — S32009D Unspecified fracture of unspecified lumbar vertebra, subsequent encounter for fracture with routine healing: Secondary | ICD-10-CM

## 2023-02-16 DIAGNOSIS — S32009A Unspecified fracture of unspecified lumbar vertebra, initial encounter for closed fracture: Secondary | ICD-10-CM | POA: Insufficient documentation

## 2023-02-16 NOTE — Progress Notes (Signed)
    Procedures performed today:    None.  Independent interpretation of notes and tests performed by another provider:   None.  Brief History, Exam, Impression, and Recommendations:    Fracture of transverse process of lumbar vertebra [facility]) Pleasant 52 year old male, 3 weeks ago accidental fall down the stairs, slipped walking in socks. He impacted his left hip, he was in severe pain and was seen in the ED, CT per his report showed lumbar transverse process fracture, unclear as to how many are which vertebrae. He is doing a lot better now, he really does not have any areas of discrete tenderness to palpation, no tenderness to percussion. We will get an updated x-ray today. He can do Tylenol as needed for pain, in 3 weeks I would like him to start aggressive core conditioning. He can return to see me back on an as-needed basis.    ____________________________________________ Ihor Austin. Benjamin Stain, M.D., ABFM., CAQSM., AME. Primary Care and Sports Medicine [place] [facility]  Adjunct Professor of Family Medicine  [place] [employer] [facility] [employer]  Restaurant manager, fast food

## 2023-02-16 NOTE — Assessment & Plan Note (Signed)
Pleasant 52 year old male, 3 weeks ago accidental fall down the stairs, slipped walking in socks. He impacted his left hip, he was in severe pain and was seen in the ED, CT per his report showed lumbar transverse process fracture, unclear as to how many are which vertebrae. He is doing a lot better now, he really does not have any areas of discrete tenderness to palpation, no tenderness to percussion. We will get an updated x-ray today. He can do Tylenol as needed for pain, in 3 weeks I would like him to start aggressive core conditioning. He can return to see me back on an as-needed basis.

## 2023-02-19 NOTE — Telephone Encounter (Signed)

## 2023-03-20 DIAGNOSIS — S32009A Unspecified fracture of unspecified lumbar vertebra, initial encounter for closed fracture: Secondary | ICD-10-CM | POA: Diagnosis not present

## 2023-04-16 DIAGNOSIS — M545 Low back pain, unspecified: Secondary | ICD-10-CM | POA: Diagnosis not present

## 2023-04-24 ENCOUNTER — Ambulatory Visit (HOSPITAL_BASED_OUTPATIENT_CLINIC_OR_DEPARTMENT_OTHER): Payer: BC Managed Care – PPO | Admitting: Student

## 2023-04-30 ENCOUNTER — Encounter (HOSPITAL_BASED_OUTPATIENT_CLINIC_OR_DEPARTMENT_OTHER): Payer: Self-pay | Admitting: Student

## 2023-04-30 ENCOUNTER — Ambulatory Visit (INDEPENDENT_AMBULATORY_CARE_PROVIDER_SITE_OTHER): Payer: BC Managed Care – PPO | Admitting: Student

## 2023-04-30 VITALS — BP 144/80 | HR 67 | Temp 97.9°F | Ht 66.54 in | Wt 175.8 lb

## 2023-04-30 DIAGNOSIS — B353 Tinea pedis: Secondary | ICD-10-CM | POA: Diagnosis not present

## 2023-04-30 DIAGNOSIS — Z136 Encounter for screening for cardiovascular disorders: Secondary | ICD-10-CM | POA: Diagnosis not present

## 2023-04-30 DIAGNOSIS — G4700 Insomnia, unspecified: Secondary | ICD-10-CM

## 2023-04-30 DIAGNOSIS — Z13220 Encounter for screening for lipoid disorders: Secondary | ICD-10-CM | POA: Diagnosis not present

## 2023-04-30 DIAGNOSIS — Z122 Encounter for screening for malignant neoplasm of respiratory organs: Secondary | ICD-10-CM

## 2023-04-30 DIAGNOSIS — M1612 Unilateral primary osteoarthritis, left hip: Secondary | ICD-10-CM | POA: Diagnosis not present

## 2023-04-30 DIAGNOSIS — Z131 Encounter for screening for diabetes mellitus: Secondary | ICD-10-CM | POA: Diagnosis not present

## 2023-04-30 DIAGNOSIS — I10 Essential (primary) hypertension: Secondary | ICD-10-CM | POA: Diagnosis not present

## 2023-04-30 DIAGNOSIS — Z7689 Persons encountering health services in other specified circumstances: Secondary | ICD-10-CM

## 2023-04-30 MED ORDER — KETOCONAZOLE 2 % EX CREA: 1.0000 | TOPICAL_CREAM | Freq: Every day | CUTANEOUS | 0 refills | Status: DC

## 2023-04-30 NOTE — Assessment & Plan Note (Signed)
Chronic issue.  Increased today.  Patient sent home with BP log.  Currently on lisinopril 10 mg tablet without any noted side effects.  May switch therapy in the future due to risk of cough on current medication.

## 2023-04-30 NOTE — Assessment & Plan Note (Signed)
Continue to follow with sports medicine, likely recommendation of hip joint injection.

## 2023-04-30 NOTE — Assessment & Plan Note (Signed)
Chronic issue.  Generally well-controlled when taking trazodone.  Patient notes noncompliance with this medication.  May discuss CBT-I in the future.

## 2023-04-30 NOTE — Assessment & Plan Note (Signed)
History of the same.  Previously on ketoconazole cream for his foot.  Patient educated on keeping his shoes dry.  Ordered ketoconazole cream to be applied daily to his right foot.

## 2023-04-30 NOTE — Progress Notes (Signed)
New Patient Office Visit  Subjective    Patient ID: George Nelson, male    DOB: July 28, 1970  Age: 53 y.o. MRN: [id number]  CC:  Chief Complaint  Patient presents with   Establish Care    Here to establish care. Was with [facility] in [place]. Need something for Athlete's foot.    HPI George Nelson presents to establish care. Prior PCP was at [facility] in [place]. Last physical was last year. he notes that he requires refills of none.  History of SVT s/p ablation. Patient is taking metoprolol. Stable, no current issues.   History of multiple broken bones.  L Hip pain (OA)- Patient notes that he has had long term hip pain. Going to provider tomorrow for continued eval and possible hip injections.  Encouraged patient to pursue hip injection if it has worked for him in the past.  Insomnia- Patient is currently taking Trazodone 50mg  for sleep. Discussed magnesium oxide or magnesium glycinate. Discussed glycine 3mg  and melatonin as additional therapies as well.  May discuss CBT-I in the future.  Hypertension- No noted chest pain, SOB, dizziness, or heart palpitations. No noted SE. Stable on lisinopril 10mg . 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  Patient provided with BP log.  Screenings:  Colon Cancer: Colonoscopy clean. 10 year follow up.  Lung Cancer: Long term smoking history. Cigarettes >30 years of smoking off and on, unsure of true pack years due to this. Recommend LC screening. Diabetes: indicates  HLD: indicates  The 10-year ASCVD risk score (Arnett DK, et al., 2019) is: 6.5%  Acute Problems: Athlete's Foot- Maceration between toes. Patient notes foul odor. Previously had ketoconazole cream that helped. Patient is okay with trying this again. No issues preventing therapy.  Outpatient Encounter Medications as of 04/30/2023  Medication Sig   ketoconazole (NIZORAL) 2 % cream Apply 1 Application topically daily.   lisinopril (ZESTRIL) 10 MG tablet Take 1  tablet by mouth daily.   metoprolol tartrate (LOPRESSOR) 50 MG tablet Take 50 mg by mouth daily.   minoxidil (LONITEN) 2.5 MG tablet Take 2.5 mg by mouth daily.   traZODone (DESYREL) 50 MG tablet Take 50 mg by mouth as needed.   triamcinolone cream (KENALOG) 0.1 % Apply 1 Application topically 2 (two) times daily.   No facility-administered encounter medications on file as of 04/30/2023.    Past Medical History:  Diagnosis Date   Acute non-recurrent frontal sinusitis 08/17/2020   Back injury    Cough 08/17/2020   Dermatitis 04/20/2019   Hemorrhoids 04/04/2020   SVT (supraventricular tachycardia) (HCC)     Past Surgical History:  Procedure Laterality Date   ANKLE SURGERY     CARDIAC SURGERY  2006   Ablation    HAND SURGERY     KNEE SURGERY      Family History  Problem Relation Age of Onset   Macular degeneration Mother    Liver cancer Father        had whipple surgery   Post-traumatic stress disorder Brother     Social History   Socioeconomic History   Marital status: Married    Spouse name: Not on file   Number of children: 1   Years of education: Not on file   Highest education level: Not on file  Occupational History   Not on file  Tobacco Use   Smoking status: Every Day    Types: Cigarettes    Passive exposure: Current   Smokeless tobacco: Never  Vaping  Use   Vaping status: Never Used  Substance and Sexual Activity   Alcohol use: Yes    Comment: 6 pack a week   Drug use: No   Sexual activity: Not on file  Other Topics Concern   Not on file  Social History Narrative   Not on file   Social Drivers of Health   Financial Resource Strain: Patient Declined (11/11/2022)   Received from [facility]   Overall Financial Resource Strain (CARDIA)    Difficulty of Paying Living Expenses: Patient declined  Food Insecurity: No Food Insecurity (04/30/2023)   Hunger Vital Sign    Worried About Running Out of Food in the Last Year: Never true    Ran Out of Food  in the Last Year: Never true  Transportation Needs: No Transportation Needs (04/30/2023)   PRAPARE - Administrator, Civil Service (Medical): No    Lack of Transportation (Non-Medical): No  Physical Activity: Sufficiently Active (11/11/2022)   Received [facility]   Exercise Vital Sign    Days of Exercise per Week: 3 days    Minutes of Exercise per Session: 60 min  Stress: Stress Concern Present (11/11/2022)   Received from [facility] - Occupational Stress Questionnaire    Feeling of Stress : To some extent  Social Connections: Moderately Integrated (11/11/2022)   Received from [facility]   Social Network    How would you rate your social network (family, work, friends)?: Adequate participation with social networks  Intimate Partner Violence: Not At Risk (04/30/2023)   Humiliation, Afraid, Rape, and Kick questionnaire    Fear of Current or Ex-Partner: No    Emotionally Abused: No    Physically Abused: No    Sexually Abused: No    ROS  Per HPI      Objective    BP (!) 144/80 (BP Location: Right Arm, Patient Position: Sitting, Cuff Size: Normal)   Pulse 67   Temp 97.9 F (36.6 C) (Oral)   Ht 5' 6.54" (1.69 m)   Wt 175 lb 12.8 oz (79.7 kg)   SpO2 97%   BMI 27.92 kg/m   Physical Exam Constitutional:      General: He is not in acute distress.    Appearance: Normal appearance. He is not ill-appearing.  HENT:     Head: Normocephalic and atraumatic.     Right Ear: External ear normal.     Left Ear: External ear normal.     Nose: Nose normal.     Mouth/Throat:     Mouth: Mucous membranes are moist.     Pharynx: Oropharynx is clear.  Eyes:     General: No scleral icterus.    Extraocular Movements: Extraocular movements intact.     Conjunctiva/sclera: Conjunctivae normal.     Pupils: Pupils are equal, round, and reactive to light.  Neck:     Vascular: No carotid bruit.  Cardiovascular:     Rate and  Rhythm: Normal rate and regular rhythm.     Pulses: Normal pulses.     Heart sounds: Normal heart sounds. No murmur heard.    No friction rub.  Pulmonary:     Effort: Pulmonary effort is normal. No respiratory distress.     Breath sounds: Normal breath sounds. No wheezing, rhonchi or rales.  Musculoskeletal:        General: Normal range of motion.     Cervical back: Neck supple.  Right lower leg: No edema.     Left lower leg: No edema.  Lymphadenopathy:     Cervical: No cervical adenopathy.  Neurological:     Mental Status: He is alert.         Assessment & Plan:   Encounter to establish care  Screening for lung cancer -     CT CHEST LUNG CANCER SCREENING LOW DOSE WO CONTRAST; Future  Tinea pedis of right foot Assessment & Plan: History of the same.  Previously on ketoconazole cream for his foot.  Patient educated on keeping his shoes dry.  Ordered ketoconazole cream to be applied daily to his right foot.  Orders: -     Ketoconazole; Apply 1 Application topically daily.  Dispense: 30 g; Refill: 0  Primary hypertension Assessment & Plan: Chronic issue.  Increased today.  Patient sent home with BP log.  Currently on lisinopril 10 mg tablet without any noted side effects.  May switch therapy in the future due to risk of cough on current medication.  Orders: -     CBC with Differential/Platelet -     Comprehensive metabolic panel  Screening for diabetes mellitus (DM) -     Hemoglobin A1c  Encounter for lipid screening for cardiovascular disease -     Lipid panel  Primary osteoarthritis of left hip Assessment & Plan: Continue to follow with sports medicine, likely recommendation of hip joint injection.   Insomnia, unspecified type Assessment & Plan: Chronic issue.  Generally well-controlled when taking trazodone.  Patient notes noncompliance with this medication.  May discuss CBT-I in the future.    I have spent greater than 60 minutes charting, educating,  diagnosing and managing this patient for this visit.   Return in about 6 weeks (around 06/11/2023) for Annual Physical.   Teryl Lucy Rudolpho Claxton, PA-C

## 2023-04-30 NOTE — Patient Instructions (Addendum)
It was nice to see you today!  As we discussed in clinic I will send in for the ketoconazole cream for your feet. Please be sure to keep your feet dry.   If you have any problems before your next visit feel free to message me via MyChart (minor issues or questions) or call the office, otherwise you may reach out to schedule an office visit.  Thank you! Gerilyn Pilgrim Jamacia Jester, PA-C

## 2023-05-01 DIAGNOSIS — S32009A Unspecified fracture of unspecified lumbar vertebra, initial encounter for closed fracture: Secondary | ICD-10-CM | POA: Diagnosis not present

## 2023-05-01 LAB — CBC WITH DIFFERENTIAL/PLATELET
Basophils Absolute: 0.1 10*3/uL (ref 0.0–0.2)
Basos: 1 %
EOS (ABSOLUTE): 0.1 10*3/uL (ref 0.0–0.4)
Eos: 2 %
Hematocrit: 44 % (ref 37.5–51.0)
Hemoglobin: 14.9 g/dL (ref 13.0–17.7)
Immature Grans (Abs): 0 10*3/uL (ref 0.0–0.1)
Immature Granulocytes: 0 %
Lymphocytes Absolute: 1.5 10*3/uL (ref 0.7–3.1)
Lymphs: 27 %
MCH: 33.3 pg — ABNORMAL HIGH (ref 26.6–33.0)
MCHC: 33.9 g/dL (ref 31.5–35.7)
MCV: 98 fL — ABNORMAL HIGH (ref 79–97)
Monocytes Absolute: 0.5 10*3/uL (ref 0.1–0.9)
Monocytes: 10 %
Neutrophils Absolute: 3.4 10*3/uL (ref 1.4–7.0)
Neutrophils: 60 %
Platelets: 255 10*3/uL (ref 150–450)
RBC: 4.48 x10E6/uL (ref 4.14–5.80)
RDW: 12.2 % (ref 11.6–15.4)
WBC: 5.6 10*3/uL (ref 3.4–10.8)

## 2023-05-01 LAB — LIPID PANEL
Chol/HDL Ratio: 2.4 {ratio} (ref 0.0–5.0)
Cholesterol, Total: 180 mg/dL (ref 100–199)
HDL: 75 mg/dL (ref >39)
LDL Chol Calc (NIH): 93 mg/dL (ref 0–99)
Triglycerides: 61 mg/dL (ref 0–149)
VLDL Cholesterol Cal: 12 mg/dL (ref 5–40)

## 2023-05-01 LAB — COMPREHENSIVE METABOLIC PANEL
ALT: 29 [IU]/L (ref 0–44)
AST: 25 [IU]/L (ref 0–40)
Albumin: 4.8 g/dL (ref 3.8–4.9)
Alkaline Phosphatase: 72 [IU]/L (ref 44–121)
BUN/Creatinine Ratio: 11 (ref 9–20)
BUN: 8 mg/dL (ref 6–24)
Bilirubin Total: 0.6 mg/dL (ref 0.0–1.2)
CO2: 24 mmol/L (ref 20–29)
Calcium: 9.7 mg/dL (ref 8.7–10.2)
Chloride: 103 mmol/L (ref 96–106)
Creatinine, Ser: 0.71 mg/dL — ABNORMAL LOW (ref 0.76–1.27)
Globulin, Total: 2.3 g/dL (ref 1.5–4.5)
Glucose: 94 mg/dL (ref 70–99)
Potassium: 4.4 mmol/L (ref 3.5–5.2)
Sodium: 141 mmol/L (ref 134–144)
Total Protein: 7.1 g/dL (ref 6.0–8.5)
eGFR: 110 mL/min/{1.73_m2} (ref >59)

## 2023-05-01 LAB — HEMOGLOBIN A1C
Est. average glucose Bld gHb Est-mCnc: 100 mg/dL
Hgb A1c MFr Bld: 5.1 % (ref 4.8–5.6)

## 2023-05-04 ENCOUNTER — Encounter (HOSPITAL_BASED_OUTPATIENT_CLINIC_OR_DEPARTMENT_OTHER): Payer: Self-pay | Admitting: Student

## 2023-05-06 ENCOUNTER — Telehealth (HOSPITAL_BASED_OUTPATIENT_CLINIC_OR_DEPARTMENT_OTHER): Payer: Self-pay | Admitting: Student

## 2023-05-06 NOTE — Telephone Encounter (Signed)
Wife advised. 

## 2023-05-18 ENCOUNTER — Other Ambulatory Visit (HOSPITAL_BASED_OUTPATIENT_CLINIC_OR_DEPARTMENT_OTHER): Payer: Self-pay

## 2023-05-18 ENCOUNTER — Other Ambulatory Visit (HOSPITAL_BASED_OUTPATIENT_CLINIC_OR_DEPARTMENT_OTHER): Payer: Self-pay | Admitting: Student

## 2023-05-18 MED ORDER — METOPROLOL TARTRATE 50 MG PO TABS
50.0000 mg | ORAL_TABLET | Freq: Every day | ORAL | 1 refills | Status: DC
  Filled 2023-05-18: qty 90, 90d supply, fill #0

## 2023-05-18 MED ORDER — METOPROLOL TARTRATE 50 MG PO TABS: 50.0000 mg | ORAL_TABLET | Freq: Every day | ORAL | 1 refills | Status: DC

## 2023-06-02 ENCOUNTER — Ambulatory Visit (HOSPITAL_BASED_OUTPATIENT_CLINIC_OR_DEPARTMENT_OTHER)
Admission: RE | Admit: 2023-06-02 | Discharge: 2023-06-02 | Disposition: A | Discharge status: Home / Self Care | Payer: BC Managed Care – PPO | Source: Ambulatory Visit | Attending: Student | Admitting: Radiology

## 2023-06-02 DIAGNOSIS — F17210 Nicotine dependence, cigarettes, uncomplicated: Secondary | ICD-10-CM | POA: Diagnosis not present

## 2023-06-02 DIAGNOSIS — Z122 Encounter for screening for malignant neoplasm of respiratory organs: Secondary | ICD-10-CM

## 2023-06-16 ENCOUNTER — Encounter (HOSPITAL_BASED_OUTPATIENT_CLINIC_OR_DEPARTMENT_OTHER): Payer: Self-pay | Admitting: Student

## 2023-06-22 ENCOUNTER — Encounter (HOSPITAL_BASED_OUTPATIENT_CLINIC_OR_DEPARTMENT_OTHER): Payer: Self-pay | Admitting: Student

## 2023-06-24 ENCOUNTER — Other Ambulatory Visit (HOSPITAL_BASED_OUTPATIENT_CLINIC_OR_DEPARTMENT_OTHER): Payer: Self-pay | Admitting: Student

## 2023-06-24 DIAGNOSIS — Z716 Tobacco abuse counseling: Secondary | ICD-10-CM

## 2023-06-24 MED ORDER — VARENICLINE TARTRATE (STARTER) 0.5 MG X 11 & 1 MG X 42 PO TBPK: ORAL_TABLET | ORAL | 0 refills | Status: DC

## 2023-06-30 NOTE — Addendum Note (Signed)
 Encounter addended by: Adolphus Birchwood, RT on: 06/30/2023 1:42 PM  Actions taken: Imaging Exam ended

## 2023-07-23 DIAGNOSIS — H47232 Glaucomatous optic atrophy, left eye: Secondary | ICD-10-CM | POA: Diagnosis not present

## 2023-08-10 ENCOUNTER — Ambulatory Visit (INDEPENDENT_AMBULATORY_CARE_PROVIDER_SITE_OTHER): Admitting: Student

## 2023-08-10 ENCOUNTER — Encounter (HOSPITAL_BASED_OUTPATIENT_CLINIC_OR_DEPARTMENT_OTHER): Payer: Self-pay | Admitting: Student

## 2023-08-10 VITALS — BP 134/89 | HR 72 | Temp 98.4°F | Resp 16 | Ht 66.0 in | Wt 178.5 lb

## 2023-08-10 DIAGNOSIS — Z8249 Family history of ischemic heart disease and other diseases of the circulatory system: Secondary | ICD-10-CM

## 2023-08-10 DIAGNOSIS — J302 Other seasonal allergic rhinitis: Secondary | ICD-10-CM | POA: Diagnosis not present

## 2023-08-10 DIAGNOSIS — R053 Chronic cough: Secondary | ICD-10-CM | POA: Insufficient documentation

## 2023-08-10 DIAGNOSIS — J439 Emphysema, unspecified: Secondary | ICD-10-CM | POA: Diagnosis not present

## 2023-08-10 DIAGNOSIS — F5101 Primary insomnia: Secondary | ICD-10-CM

## 2023-08-10 DIAGNOSIS — I10 Essential (primary) hypertension: Secondary | ICD-10-CM

## 2023-08-10 MED ORDER — LOSARTAN POTASSIUM 50 MG PO TABS: 50.0000 mg | ORAL_TABLET | Freq: Every day | ORAL | 6 refills | Status: DC

## 2023-08-10 NOTE — Assessment & Plan Note (Signed)
 Presence of the same with possible exacerbation. - Consider 2nd gen antihistamine - Consider flonase if there is a component of rhinitis

## 2023-08-10 NOTE — Assessment & Plan Note (Signed)
 Reports difficulty sleeping, including waking at 3 AM and inability to return to sleep. Occasionally uses trazodone but often forgets. Environmental factors such as room temperature may contribute. Cognitive behavioral therapy for insomnia (CBT-I) is recommended as first-line treatment. - Review CBT-I resources. - Consider magnesium glycinate for sleep support. - Adjust room temperature to 70F or lower for improved sleep quality.

## 2023-08-10 NOTE — Progress Notes (Unsigned)
 Established Patient Office Visit  Subjective   Patient ID: George Nelson, male    DOB: May 31, 1970  Age: 53 y.o. MRN: [id number]  Chief Complaint  Patient presents with   Medical Management of Chronic Issues    Here for follow up. Has had constant cough for 5 years. Had CT Lung Cancer Screening down. Showed it had emphysema. Would like to go ahead and get referral for LFT. Has episodes where he cannot breathe. Usually gets worse around Jan & Feb. BP will drop during weekend when not working and when he goes back to work it jumps back up around 160's (SYSTOLIC). Has been having night sweats for years. Depending on stress.    HPI  Discussed the use of AI scribe software for clinical note transcription with the patient, who gave verbal consent to proceed.  History of Present Illness   George Nelson "George Nelson" is a 53 year old male with emphysema and hypertension who presents for follow-up of chronic cough and blood pressure management.  He has experienced a chronic cough for five years, producing clear, slimy mucus. The cough worsens in the winter and is currently slightly aggravating, possibly due to a recent cold. He has noticed a diminished sense of smell, which he attributes to a possible viral infection contracted from his son. Allergies occasionally flare up, particularly at night, causing nasal swelling and snoring.  A low dose CT of the chest showed signs of emphysema. He has a history of smoking, which contributes to his respiratory symptoms. He used to be very active, cycling 25 miles with significant elevation gain multiple times a week, and wishes to return to that level of activity.  He is currently taking lisinopril 10 mg and metoprolol  for hypertension. He previously ignored his blood pressure medication but has been consistent with it recently. His blood pressure tends to rise mid-week and decrease by the weekend, correlating with his activity levels. He avoids processed foods and fast  food, although he occasionally indulges due to his son's influence.  He experiences night sweats occasionally, (none noted as drenching), but attributes some of the sweating to a thick blanket and a warm room temperature. He keeps his house at 72 degrees at night, which might be too warm for optimal sleep.  He has a history of SVT for which he underwent an ablation approximately 18 years ago. He recalls an episode two months ago where he experienced difficulty breathing during activity, describing it as feeling like a pillow was over his face. He managed to calm himself down and did not seek immediate medical attention.  His family history includes heart issues on his grandmother's side, with relatives experiencing heart attacks and requiring bypass surgery. He is concerned about his own cardiac health given this family history.     {History (Optional):23778}  ROS Per HPI.    Objective:     BP 134/89   Pulse 72   Temp 98.4 F (36.9 C) (Oral)   Resp 16   Ht 5\' 6"  (1.676 m)   Wt 178 lb 8 oz (81 kg)   SpO2 98%   BMI 28.81 kg/m  {Vitals History (Optional):23777}  Physical Exam Constitutional:      General: He is not in acute distress.    Appearance: Normal appearance. He is not ill-appearing.  HENT:     Head: Normocephalic and atraumatic.     Right Ear: External ear normal.     Left Ear: External ear normal.     Nose:  Nose normal.  Eyes:     Conjunctiva/sclera: Conjunctivae normal.  Cardiovascular:     Rate and Rhythm: Normal rate and regular rhythm.     Pulses: Normal pulses.     Heart sounds: Normal heart sounds. No murmur heard.    No friction rub.  Pulmonary:     Effort: Pulmonary effort is normal. No respiratory distress.     Breath sounds: Normal breath sounds. No wheezing, rhonchi or rales.  Skin:    General: Skin is warm and dry.     Coloration: Skin is not jaundiced or pale.  Neurological:     Mental Status: He is alert.  Psychiatric:        Mood and Affect:  Mood normal.        Behavior: Behavior normal.      No results found for any visits on 08/10/23.  {Labs (Optional):23779}  The 10-year ASCVD risk score (Arnett DK, et al., 2019) is: 7%    Assessment & Plan:   Chronic cough Assessment & Plan: Chronic cough for five years, producing clear mucus. Possible contributing factors include allergies, smoking, and lisinopril. Allergies may also cause nasal congestion and snoring. - Discontinue lisinopril 10 mg. - Start losartan 50 mg daily. - Evaluate response to medication change.  Orders: -     Losartan Potassium; Take 1 tablet (50 mg total) by mouth daily.  Dispense: 30 tablet; Refill: 6  Seasonal allergies Assessment & Plan: Presence of the same with possible exacerbation. - Consider 2nd gen antihistamine - Consider flonase if there is a component of rhinitis   Pulmonary emphysema, unspecified emphysema type (HCC) Assessment & Plan: Signs of emphysema on low dose CT of the chest. Symptoms include chronic cough and shortness of breath. Smoking history is a contributing factor. Pulmonary function tests (PFTs) are recommended to assess lung function and extent of emphysema. - Order pulmonary function tests (PFTs). - Continue annual low dose CT of the chest.   Family history of cardiovascular disease Assessment & Plan: No current symptoms. Family history of heart disease raises concern for potential cardiac issues. Discussed CT coronary artery calcium test to assess for calcified plaques, indicating risk for heart disease. It provides a comparative risk assessment based on age, race, and gender. - Consider CT coronary artery calcium test to assess for calcified plaques.   Orders: -     CT CARDIAC SCORING (SELF PAY ONLY); Future  Primary hypertension Assessment & Plan: Hypertension is slightly elevated, managed with lisinopril 10 mg and metoprolol . Blood pressure shows variability, with higher readings during the workweek.  Stress and lifestyle factors may contribute. Lisinopril may cause chronic cough. - Discontinue lisinopril 10 mg. - Start losartan 50 mg daily. - Monitor blood pressure on losartan.   Primary insomnia Assessment & Plan: Reports difficulty sleeping, including waking at 3 AM and inability to return to sleep. Occasionally uses trazodone but often forgets. Environmental factors such as room temperature may contribute. Cognitive behavioral therapy for insomnia (CBT-I) is recommended as first-line treatment. - Review CBT-I resources. - Consider magnesium glycinate for sleep support. - Adjust room temperature to 19F or lower for improved sleep quality.   Return in about 6 months (around 02/09/2024) for Chronic Followup.    Rajvi Armentor T Tymothy Cass, PA-C

## 2023-08-10 NOTE — Assessment & Plan Note (Signed)
 Chronic cough for five years, producing clear mucus. Possible contributing factors include allergies, smoking, and lisinopril. Allergies may also cause nasal congestion and snoring. - Discontinue lisinopril 10 mg. - Start losartan 50 mg daily. - Evaluate response to medication change.

## 2023-08-10 NOTE — Assessment & Plan Note (Signed)
 Suspect that chronic cough is a side effect of lisinopril treatment. Hypertension is slightly elevated, managed with lisinopril 10 mg and metoprolol . Blood pressure shows variability, with higher readings during the workweek. Stress and lifestyle factors may contribute. Lisinopril may cause chronic cough. - Discontinue lisinopril 10 mg. - Start losartan 50 mg daily. - Monitor blood pressure on losartan.

## 2023-08-10 NOTE — Patient Instructions (Addendum)
 It was nice to see you today!  As we discussed in clinic:  A type of therapy known as CBT-I is considered the first-line treatment for insomnia. It may even be able to help 4/5 individuals get better sleep. At this time I am unaware of many institutions offer CBT-I within our area.  There are some online therapy use which can be both effective for sleep and cost effective as well.  Go! To Sleep- Web-based App-this is a Energy manager therapy platform created by [facility] clinic for patients with insomnia.  Last time checked, the cost was around $40 (pretty cheap for better sleep!).  CBT-I Coach- iOS/Android App- This app provides education about sleep and CBT-I, personalized feedback about your sleep, and an option for sleep diary to track wake and sleep times.  Sleepio-  iOS/Android App and Web-based App- A fairly popular option, may be worth a try!  [facility] Insomnia- (text based)- this option doesn't provide with audio or with an app but Joann Mu has some helpful information for you!  If you have any problems before your next visit feel free to message me via MyChart (minor issues or questions) or call the office, otherwise you may reach out to schedule an office visit.  Thank you! Marilyn Wing, PA-C

## 2023-08-10 NOTE — Assessment & Plan Note (Signed)
 Signs of emphysema on low dose CT of the chest. Symptoms include chronic cough and shortness of breath. Smoking history is a contributing factor. Pulmonary function tests (PFTs) are recommended to assess lung function and extent of emphysema. - Order pulmonary function tests (PFTs). - Continue annual low dose CT of the chest.

## 2023-08-10 NOTE — Assessment & Plan Note (Signed)
 No current symptoms. Family history of heart disease raises concern for potential cardiac issues. Discussed CT coronary artery calcium test to assess for calcified plaques, indicating risk for heart disease. It provides a comparative risk assessment based on age, race, and gender. - Consider CT coronary artery calcium test to assess for calcified plaques.

## 2023-09-15 ENCOUNTER — Encounter (INDEPENDENT_AMBULATORY_CARE_PROVIDER_SITE_OTHER): Payer: Self-pay | Admitting: Sports Medicine

## 2023-09-15 DIAGNOSIS — G8929 Other chronic pain: Secondary | ICD-10-CM

## 2023-09-15 DIAGNOSIS — M545 Low back pain, unspecified: Secondary | ICD-10-CM

## 2023-09-15 NOTE — Telephone Encounter (Signed)

## 2023-09-16 ENCOUNTER — Encounter (HOSPITAL_BASED_OUTPATIENT_CLINIC_OR_DEPARTMENT_OTHER): Payer: Self-pay | Admitting: Student

## 2023-09-17 ENCOUNTER — Other Ambulatory Visit (HOSPITAL_BASED_OUTPATIENT_CLINIC_OR_DEPARTMENT_OTHER): Payer: Self-pay | Admitting: Student

## 2023-09-17 DIAGNOSIS — R21 Rash and other nonspecific skin eruption: Secondary | ICD-10-CM

## 2023-09-17 DIAGNOSIS — B353 Tinea pedis: Secondary | ICD-10-CM

## 2023-09-17 MED ORDER — KETOCONAZOLE 2 % EX CREA: 1.0000 | TOPICAL_CREAM | Freq: Every day | CUTANEOUS | 0 refills | Status: AC | PRN

## 2023-09-17 MED ORDER — TRIAMCINOLONE ACETONIDE 0.1 % EX CREA: 1.0000 | TOPICAL_CREAM | Freq: Two times a day (BID) | CUTANEOUS | 3 refills | Status: AC

## 2023-09-17 NOTE — Progress Notes (Signed)
 Telephone call: patient was having issues with medications. New refills sent in and directed him to discuss with pharmacy for any further issues.

## 2023-09-18 ENCOUNTER — Encounter (HOSPITAL_BASED_OUTPATIENT_CLINIC_OR_DEPARTMENT_OTHER): Payer: Self-pay | Admitting: Student

## 2023-10-01 ENCOUNTER — Encounter (HOSPITAL_BASED_OUTPATIENT_CLINIC_OR_DEPARTMENT_OTHER): Payer: Self-pay | Admitting: Student

## 2023-10-01 MED ORDER — MINOXIDIL 2.5 MG PO TABS: 2.5000 mg | ORAL_TABLET | Freq: Every day | ORAL | 1 refills | Status: AC

## 2023-10-09 DIAGNOSIS — D239 Other benign neoplasm of skin, unspecified: Secondary | ICD-10-CM | POA: Diagnosis not present

## 2023-10-09 DIAGNOSIS — I10 Essential (primary) hypertension: Secondary | ICD-10-CM | POA: Diagnosis not present

## 2023-10-09 DIAGNOSIS — D2260 Melanocytic nevi of unspecified upper limb, including shoulder: Secondary | ICD-10-CM | POA: Diagnosis not present

## 2023-10-09 DIAGNOSIS — L739 Follicular disorder, unspecified: Secondary | ICD-10-CM | POA: Diagnosis not present

## 2023-10-09 DIAGNOSIS — D2270 Melanocytic nevi of unspecified lower limb, including hip: Secondary | ICD-10-CM | POA: Diagnosis not present

## 2023-10-09 DIAGNOSIS — D492 Neoplasm of unspecified behavior of bone, soft tissue, and skin: Secondary | ICD-10-CM | POA: Diagnosis not present

## 2023-10-09 DIAGNOSIS — D2239 Melanocytic nevi of other parts of face: Secondary | ICD-10-CM | POA: Diagnosis not present

## 2023-10-09 DIAGNOSIS — L663 Perifolliculitis capitis abscedens: Secondary | ICD-10-CM | POA: Diagnosis not present

## 2023-10-09 DIAGNOSIS — D225 Melanocytic nevi of trunk: Secondary | ICD-10-CM | POA: Diagnosis not present

## 2023-10-23 ENCOUNTER — Other Ambulatory Visit (HOSPITAL_BASED_OUTPATIENT_CLINIC_OR_DEPARTMENT_OTHER): Payer: Self-pay | Admitting: Radiology

## 2023-10-30 ENCOUNTER — Ambulatory Visit (HOSPITAL_BASED_OUTPATIENT_CLINIC_OR_DEPARTMENT_OTHER)
Admission: RE | Admit: 2023-10-30 | Discharge: 2023-10-30 | Disposition: A | Discharge status: Home / Self Care | Payer: Self-pay | Source: Ambulatory Visit | Attending: Student | Admitting: Student

## 2023-10-30 DIAGNOSIS — Z8249 Family history of ischemic heart disease and other diseases of the circulatory system: Secondary | ICD-10-CM

## 2023-11-02 ENCOUNTER — Ambulatory Visit (HOSPITAL_BASED_OUTPATIENT_CLINIC_OR_DEPARTMENT_OTHER): Payer: Self-pay | Admitting: Family Medicine

## 2023-11-05 ENCOUNTER — Ambulatory Visit (INDEPENDENT_AMBULATORY_CARE_PROVIDER_SITE_OTHER): Admitting: Sports Medicine

## 2023-11-05 ENCOUNTER — Ambulatory Visit

## 2023-11-05 DIAGNOSIS — D220 Melanocytic nevi of lip: Secondary | ICD-10-CM | POA: Diagnosis not present

## 2023-11-05 DIAGNOSIS — M1129 Other chondrocalcinosis, multiple sites: Secondary | ICD-10-CM | POA: Diagnosis not present

## 2023-11-05 DIAGNOSIS — I10 Essential (primary) hypertension: Secondary | ICD-10-CM | POA: Diagnosis not present

## 2023-11-05 DIAGNOSIS — L720 Epidermal cyst: Secondary | ICD-10-CM | POA: Diagnosis not present

## 2023-11-05 DIAGNOSIS — M25462 Effusion, left knee: Secondary | ICD-10-CM | POA: Diagnosis not present

## 2023-11-05 DIAGNOSIS — G8929 Other chronic pain: Secondary | ICD-10-CM | POA: Diagnosis not present

## 2023-11-05 DIAGNOSIS — D2339 Other benign neoplasm of skin of other parts of face: Secondary | ICD-10-CM | POA: Diagnosis not present

## 2023-11-05 DIAGNOSIS — M170 Bilateral primary osteoarthritis of knee: Secondary | ICD-10-CM | POA: Diagnosis not present

## 2023-11-05 DIAGNOSIS — D230 Other benign neoplasm of skin of lip: Secondary | ICD-10-CM | POA: Diagnosis not present

## 2023-11-05 DIAGNOSIS — M25461 Effusion, right knee: Secondary | ICD-10-CM | POA: Diagnosis not present

## 2023-11-05 DIAGNOSIS — D2239 Melanocytic nevi of other parts of face: Secondary | ICD-10-CM | POA: Diagnosis not present

## 2023-11-05 DIAGNOSIS — M545 Low back pain, unspecified: Secondary | ICD-10-CM

## 2023-11-05 DIAGNOSIS — D492 Neoplasm of unspecified behavior of bone, soft tissue, and skin: Secondary | ICD-10-CM | POA: Diagnosis not present

## 2023-11-05 DIAGNOSIS — L821 Other seborrheic keratosis: Secondary | ICD-10-CM | POA: Diagnosis not present

## 2023-11-05 DIAGNOSIS — M25562 Pain in left knee: Secondary | ICD-10-CM | POA: Diagnosis not present

## 2023-11-05 DIAGNOSIS — Q828 Other specified congenital malformations of skin: Secondary | ICD-10-CM | POA: Diagnosis not present

## 2023-11-05 MED ORDER — TRAMADOL HCL 50 MG PO TABS: 50.0000 mg | ORAL_TABLET | Freq: Three times a day (TID) | ORAL | 0 refills | Status: AC | PRN

## 2023-11-05 MED ORDER — PREDNISONE 50 MG PO TABS: ORAL_TABLET | ORAL | 0 refills | Status: DC

## 2023-11-05 NOTE — Assessment & Plan Note (Signed)
 Also with recurrence of low back pain, we discussed this back in 2021, he has known lumbar DDD, adding 5 days of prednisone , tramadol , he will do some home conditioning and return to see me 6 weeks, MRI for epidural planning if not better.

## 2023-11-05 NOTE — Progress Notes (Signed)
    Procedures performed today:    None.  Independent interpretation of notes and tests performed by another provider:   None.  Brief History, Exam, Impression, and Recommendations:    Primary osteoarthritis of both knees Mild pain anterior knees bilaterally, moderate crepitus, gelling, explained the anatomy and pathophysiology, he will do some home conditioning, prednisone  as above for his back.. Return to see me 6 weeks as needed.  Chronic low back pain Also with recurrence of low back pain, we discussed this back in 2021, he has known lumbar DDD, adding 5 days of prednisone , tramadol , he will do some home conditioning and return to see me 6 weeks, MRI for epidural planning if not better.    ____________________________________________ Debby PARAS. Curtis, M.D., ABFM., CAQSM., AME. Primary Care and Sports Medicine [facility]  Adjunct Professor of [facility]  [employer]  Restaurant manager, fast food

## 2023-11-05 NOTE — Assessment & Plan Note (Signed)
 Mild pain anterior knees bilaterally, moderate crepitus, gelling, explained the anatomy and pathophysiology, he will do some home conditioning, prednisone  as above for his back.. Return to see me 6 weeks as needed.

## 2023-11-12 ENCOUNTER — Ambulatory Visit: Payer: Self-pay | Admitting: Sports Medicine

## 2023-12-03 ENCOUNTER — Other Ambulatory Visit: Payer: Self-pay

## 2023-12-03 DIAGNOSIS — M170 Bilateral primary osteoarthritis of knee: Secondary | ICD-10-CM

## 2023-12-03 MED ORDER — PREDNISONE 50 MG PO TABS: ORAL_TABLET | ORAL | 0 refills | Status: DC

## 2023-12-03 NOTE — Telephone Encounter (Signed)
 Pharmacy requesting a refill of this medication.

## 2023-12-15 ENCOUNTER — Encounter: Payer: Self-pay | Admitting: Sports Medicine

## 2024-01-28 DIAGNOSIS — H40013 Open angle with borderline findings, low risk, bilateral: Secondary | ICD-10-CM | POA: Diagnosis not present

## 2024-02-04 ENCOUNTER — Ambulatory Visit: Payer: Self-pay

## 2024-02-04 NOTE — Telephone Encounter (Signed)
 FYI Only or Action Required?: FYI only for provider.  Patient was last seen in primary care on 11/05/2023 by Curtis Debby PARAS, MD.  Called Nurse Triage reporting Advice Only.  Triage Disposition: Call PCP When Office is Open  Patient/caregiver understands and will follow disposition?: Yes      Message from [facility] S sent at 02/04/2024  8:19 AM EDT  Reason for Triage: right foot feels cold and damp, started within the past month       Reason for Disposition  Nursing judgment or information in reference  Answer Assessment - Initial Assessment Questions 1. REASON FOR CALL: What is your main concern right now?     Questions about paperwork. Patient need paperwork renewed for him to work form home. Patient was a previous patient of Dr. ONEIDA and is concerned about getting the paperwork filled out. Patient has two appointments next week. I have advised him to bring the paperwork with him and speak with the provider he is seeing to see what they can do to help. Patient verbalized understanding and agreement this his plan.  Protocols used: No Guideline Available-A-AH

## 2024-02-04 NOTE — Telephone Encounter (Signed)
 Message from [place] S sent at 02/04/2024  8:19 AM EDT  Reason for Triage: right foot feels cold and damp, started within the past month

## 2024-02-08 ENCOUNTER — Ambulatory Visit: Payer: Self-pay

## 2024-02-08 NOTE — Telephone Encounter (Signed)
 FYI Only or Action Required?: FYI only for provider.  Patient was last seen in primary care on 11/05/2023 by Curtis Debby PARAS, MD.  Called Nurse Triage reporting Documents.  Symptoms began several months ago.  Interventions attempted: Nothing.  Symptoms are: stable.  Triage Disposition: Information or Advice Only Call  Patient/caregiver understands and will follow disposition?: Yes Reason for Disposition  Health information question, no triage required and triager able to answer question  Answer Assessment - Initial Assessment Questions 1. REASON FOR CALL: What is the main reason for your call? or How can I best help you?     Patient was returning call from last week. Patient stated talking about his back issues from previous years ago and how he has hip issues and has documents he wants the provider to look over so he can continue to work from home.   2. SYMPTOMS : Do you have any symptoms?      New: old & damp right foot, started 3 months ago. Happens mostly when sitting, standing helps but does not relieve it. Only way to describe it is wet and cold.  Protocols used: Information Only Call - No Triage-A-AH

## 2024-02-11 ENCOUNTER — Ambulatory Visit (HOSPITAL_BASED_OUTPATIENT_CLINIC_OR_DEPARTMENT_OTHER): Admitting: Family Medicine

## 2024-02-11 VITALS — BP 148/95 | HR 68 | Temp 98.1°F | Resp 16 | Wt 182.0 lb

## 2024-02-11 DIAGNOSIS — G629 Polyneuropathy, unspecified: Secondary | ICD-10-CM

## 2024-02-11 DIAGNOSIS — M545 Low back pain, unspecified: Secondary | ICD-10-CM | POA: Diagnosis not present

## 2024-02-11 DIAGNOSIS — G8929 Other chronic pain: Secondary | ICD-10-CM

## 2024-02-11 NOTE — Progress Notes (Signed)
 Established Patient Office Visit  Subjective   Patient ID: George Nelson, male    DOB: 1971/01/02  Age: 53 y.o. MRN: [id number]  No chief complaint on file.   Usually sees George Nelson, George Nelson.  Patient states his right foot has had 6 months of unusual sensation along the lateral portion of the foot.  Denies pain, numbness, or tingling.  He does have chronic back pain after some trauma in the distant past.  He would like to see a spine surgeon again.  History not typical for Sciatic inflammation.    Past Medical History:  Diagnosis Date   Acute non-recurrent frontal sinusitis 08/17/2020   Back injury    Cough 08/17/2020   Dermatitis 04/20/2019   Hemorrhoids 04/04/2020   SVT (supraventricular tachycardia)     Outpatient Encounter Medications as of 02/11/2024  Medication Sig   clobetasol cream (TEMOVATE) 0.05 % Apply topically.   fluticasone (FLONASE) 50 MCG/ACT nasal spray Place 2 sprays into both nostrils daily.   lisinopril (ZESTRIL) 10 MG tablet Take 10 mg by mouth daily.   metroNIDAZOLE (METROCREAM) 0.75 % cream Apply topically See admin instructions.   ketoconazole  (NIZORAL ) 2 % cream Apply 1 Application topically daily as needed for irritation. For foot fungus.   losartan  (COZAAR ) 50 MG tablet Take 1 tablet (50 mg total) by mouth daily.   metoprolol  tartrate (LOPRESSOR ) 50 MG tablet Take 1 tablet (50 mg total) by mouth daily.   metoprolol  tartrate (LOPRESSOR ) 50 MG tablet Take 1 tablet (50 mg total) by mouth daily.   minoxidil  (LONITEN ) 2.5 MG tablet Take 1 tablet (2.5 mg total) by mouth daily.   predniSONE  (DELTASONE ) 50 MG tablet One tab PO daily for 5 days.   traMADol  (ULTRAM ) 50 MG tablet Take 1 tablet (50 mg total) by mouth every 8 (eight) hours as needed for moderate pain (pain score 4-6).   traZODone (DESYREL) 50 MG tablet Take 50 mg by mouth as needed.   triamcinolone  cream (KENALOG ) 0.1 % Apply 1 Application topically 2 (two) times daily. For rash.   Varenicline   Tartrate, Starter, 0.5 MG X 11 & 1 MG X 42 TBPK Take according to directions on the packaging. (Patient not taking: Reported on 11/05/2023)   No facility-administered encounter medications on file as of 02/11/2024.    Social History   Tobacco Use   Smoking status: Some Days    Types: Cigarettes    Passive exposure: Past   Smokeless tobacco: Never   Tobacco comments:    HAD QUIT 10 YEARS   Vaping Use   Vaping status: Never Used  Substance Use Topics   Alcohol use: Yes    Comment: 6 pack a week   Drug use: No      Review of Systems  Constitutional:  Negative for fever and malaise/fatigue.  HENT:  Negative for hearing loss.   Cardiovascular:  Negative for chest pain.  Gastrointestinal:  Negative for abdominal pain.  Skin:  Negative for rash.  Neurological:  Positive for sensory change. Negative for dizziness, tingling, tremors, focal weakness, seizures, loss of consciousness and headaches.  Psychiatric/Behavioral:  Negative for depression, hallucinations and memory loss. The patient does not have insomnia.       Objective:     BP (!) 148/95 (Cuff Size: Normal)   Pulse 68   Temp 98.1 F (36.7 C) (Oral)   Resp 16   Wt 182 lb (82.6 kg)   SpO2 97%   BMI 29.38 kg/m    Physical Exam  Constitutional:      General: He is not in acute distress.    Appearance: Normal appearance.  HENT:     Head: Normocephalic.  Neck:     Vascular: No carotid bruit.  Cardiovascular:     Rate and Rhythm: Normal rate and regular rhythm.     Pulses: Normal pulses.     Heart sounds: Normal heart sounds.  Pulmonary:     Effort: Pulmonary effort is normal.     Breath sounds: Normal breath sounds.  Abdominal:     General: Bowel sounds are normal.     Palpations: Abdomen is soft.  Musculoskeletal:     Cervical back: Neck supple. No tenderness.     Right lower leg: No edema.     Left lower leg: No edema.     Comments: Mild lumbago with negative SLR's.  Neurological:     General: No  focal deficit present.     Mental Status: He is alert.     Comments: Sensation along his right foot is normal to my exam.      No results found for any visits on 02/11/24.    The 10-year ASCVD risk score (Arnett DK, et al., 2019) is: 9%    Assessment & Plan:  Neuropathy Assessment & Plan: Possible.  Atypical.  Neurology referral.  Orders: -     Ambulatory referral to Neurology  Chronic right-sided low back pain without sciatica -     Ambulatory referral to Orthopedic Surgery  I personally spent a total of 25 minutes in the care of the patient today including performing a medically appropriate exam/evaluation, counseling and educating, documenting clinical information in the EHR, and communicating results.   Return if symptoms worsen or fail to improve.    REDDING PONCE NORLEEN FALCON., MD

## 2024-02-11 NOTE — Assessment & Plan Note (Signed)
 Possible.  Atypical.  Neurology referral.

## 2024-02-12 ENCOUNTER — Ambulatory Visit (HOSPITAL_BASED_OUTPATIENT_CLINIC_OR_DEPARTMENT_OTHER): Admitting: Student

## 2024-02-12 ENCOUNTER — Encounter (HOSPITAL_BASED_OUTPATIENT_CLINIC_OR_DEPARTMENT_OTHER): Payer: Self-pay | Admitting: Student

## 2024-02-12 VITALS — BP 152/91 | HR 61 | Ht 66.0 in | Wt 193.4 lb

## 2024-02-12 DIAGNOSIS — G8929 Other chronic pain: Secondary | ICD-10-CM

## 2024-02-12 DIAGNOSIS — M545 Low back pain, unspecified: Secondary | ICD-10-CM

## 2024-02-12 DIAGNOSIS — M1612 Unilateral primary osteoarthritis, left hip: Secondary | ICD-10-CM | POA: Diagnosis not present

## 2024-02-12 DIAGNOSIS — F17220 Nicotine dependence, chewing tobacco, uncomplicated: Secondary | ICD-10-CM

## 2024-02-12 DIAGNOSIS — Z23 Encounter for immunization: Secondary | ICD-10-CM

## 2024-02-12 DIAGNOSIS — I10 Essential (primary) hypertension: Secondary | ICD-10-CM | POA: Diagnosis not present

## 2024-02-12 MED ORDER — LOSARTAN POTASSIUM 100 MG PO TABS
100.0000 mg | ORAL_TABLET | Freq: Every day | ORAL | 6 refills | Status: AC
Start: 2024-02-12 — End: ?

## 2024-02-12 NOTE — Patient Instructions (Signed)
 It was nice to see you today!  If you have any problems before your next visit feel free to message me via MyChart (minor issues or questions) or call the office, otherwise you may reach out to schedule an office visit.  Thank you! Pau Banh, PA-C

## 2024-02-12 NOTE — Progress Notes (Signed)
 Established Patient Office Visit  Subjective   Patient ID: George Nelson, male    DOB: January 15, 1971  Age: 53 y.o. MRN: [id number]  Chief Complaint  Patient presents with   Medical Management of Chronic Issues    HPI  Discussed the use of AI scribe software for clinical note transcription with the patient, who gave verbal consent to proceed.  History of Present Illness   George Nelson is a 53 year old male with chronic back pain and hypertension who presents for a follow-up visit.  He experiences a persistent and worsening sensation in his foot, which is absent when lying flat or sleeping but becomes apparent when sitting. No specific cause has been identified for this sensation.  He has a history of chronic back pain, exacerbated by a fall last year resulting in a broken back. The pain is located in the lower back and hips, particularly affecting the left hip, and is described as 'lightning' or 'electric' in nature. It is aggravated by movement, such as getting out of a car or doing yard work. He stopped playing golf three years ago due to the progression of this pain. He works from home due to his back condition and requires documentation for work accommodations.  He has hypertension and is currently taking losartan  and metoprolol . His blood pressure has been 'clunky' and not well-controlled since switching from lisinopril to losartan . He experiences occasional 'nerves' but metoprolol  helps manage his heart rate.  He mentions a chronic cough that resolved after discontinuing lisinopril. He also uses chewing tobacco occasionally for relaxation.  His social history includes working for [employer] for three years. He describes his work as stressful but notes improvements in team dynamics and communication. He has a son in college and expresses concern about financial stress related to family expenses. He shares a complicated family history, including a strained relationship with his  mother and a supportive relationship with his adoptive father, who is dealing with cancer.      Patient Active Problem List   Diagnosis Date Noted   Neuropathy 02/11/2024   Primary osteoarthritis of both knees 11/05/2023   Seasonal allergies 08/10/2023   Pulmonary emphysema (HCC) 08/10/2023   Family history of cardiovascular disease 08/10/2023   Chronic cough 08/10/2023   Primary hypertension 04/30/2023   Tinea pedis of right foot 04/30/2023   Insomnia 04/30/2023   Fracture of transverse process of lumbar vertebra (HCC) 02/16/2023   Impingement syndrome, shoulder, right 10/05/2020   Chronic low back pain 01/03/2020   Arthropathy of cervical facet joint 09/16/2016   Right foot pain 03/31/2016   Primary osteoarthritis of left hip 03/03/2016   AC separation, type 2 06/18/2015   Right cervical radiculopathy 06/18/2015   Past Medical History:  Diagnosis Date   Acute non-recurrent frontal sinusitis 08/17/2020   Back injury    Cough 08/17/2020   Dermatitis 04/20/2019   Hemorrhoids 04/04/2020   SVT (supraventricular tachycardia)    Social History   Tobacco Use   Smoking status: Some Days    Types: Cigarettes    Passive exposure: Past   Smokeless tobacco: Never   Tobacco comments:    HAD QUIT 10 YEARS   Vaping Use   Vaping status: Never Used  Substance Use Topics   Alcohol use: Yes    Comment: 6 pack a week   Drug use: No   No Known Allergies    ROS Per HPI.    Objective:     BP (!) 152/91  Pulse 61   Ht 5' 6 (1.676 m)   Wt 193 lb 6.4 oz (87.7 kg)   SpO2 97%   BMI 31.22 kg/m  BP Readings from Last 3 Encounters:  02/12/24 (!) 152/91  02/11/24 (!) 148/95  08/10/23 134/89   Wt Readings from Last 3 Encounters:  02/12/24 193 lb 6.4 oz (87.7 kg)  02/11/24 182 lb (82.6 kg)  08/10/23 178 lb 8 oz (81 kg)      Physical Exam Constitutional:      General: He is not in acute distress.    Appearance: Normal appearance. He is not ill-appearing.  HENT:      Head: Normocephalic and atraumatic.     Right Ear: External ear normal.     Left Ear: External ear normal.     Nose: Nose normal.  Eyes:     Conjunctiva/sclera: Conjunctivae normal.  Cardiovascular:     Rate and Rhythm: Normal rate and regular rhythm.     Pulses: Normal pulses.     Heart sounds: Normal heart sounds. No murmur heard.    No friction rub.  Pulmonary:     Effort: Pulmonary effort is normal. No respiratory distress.     Breath sounds: Normal breath sounds. No wheezing, rhonchi or rales.  Musculoskeletal:     Comments: Back Exam:  Inspection: Unremarkable  Motion: Flexion 45 deg, Extension 45 deg, Side Bending to 45 deg bilaterally,  Rotation to 45 deg bilaterally  Palpable tenderness: None. Some pain with movement noted in lower back and into left hip. Greater trochanter nontender to palpation. Sensory change: no gross changes noted Leg strength  Quad: 5/5 Hamstring: 5/5 Gait unremarkable.    Skin:    General: Skin is warm and dry.     Coloration: Skin is not jaundiced or pale.  Neurological:     Mental Status: He is alert.  Psychiatric:        Mood and Affect: Mood normal.        Behavior: Behavior normal.      No results found for any visits on 02/12/24.  Last CBC Lab Results  Component Value Date   WBC 5.6 04/30/2023   HGB 14.9 04/30/2023   HCT 44.0 04/30/2023   MCV 98 (H) 04/30/2023   MCH 33.3 (H) 04/30/2023   RDW 12.2 04/30/2023   PLT 255 04/30/2023   Last metabolic panel Lab Results  Component Value Date   GLUCOSE 94 04/30/2023   NA 141 04/30/2023   K 4.4 04/30/2023   CL 103 04/30/2023   CO2 24 04/30/2023   BUN 8 04/30/2023   CREATININE 0.71 (L) 04/30/2023   EGFR 110 04/30/2023   CALCIUM 9.7 04/30/2023   PROT 7.1 04/30/2023   ALBUMIN 4.8 04/30/2023   LABGLOB 2.3 04/30/2023   BILITOT 0.6 04/30/2023   ALKPHOS 72 04/30/2023   AST 25 04/30/2023   ALT 29 04/30/2023   Last lipids Lab Results  Component Value Date   CHOL 180  04/30/2023   HDL 75 04/30/2023   LDLCALC 93 04/30/2023   TRIG 61 04/30/2023   CHOLHDL 2.4 04/30/2023   Last hemoglobin A1c Lab Results  Component Value Date   HGBA1C 5.1 04/30/2023      The 10-year ASCVD risk score (Arnett DK, et al., 2019) is: 9.4%    Assessment & Plan:   Assessment and Plan    Essential hypertension Chronic, not at goal. Blood pressure is elevated at 152/91 mmHg, likely due to stress and recent medication change from  lisinopril to losartan . Current regimen includes losartan  and metoprolol . Cough resolved after switch from lisinopril to losartan . - Increased losartan  dosage to 100 mg daily - Scheduled nurse visit for blood pressure check in 8 weeks  Chronic low back pain and degenerative joint disease of hips Chronic low back pain with degenerative joint disease in hips, exacerbated by movement and activities such as yard work. Pain is more pronounced in the left hip. Recent x-ray shows colorectal syndrome at 32 degrees, unlikely to be a serious abnormality. - Referred to orthopedic surgeon for further evaluation already - Will consider physical therapy to strengthen back and prevent pain if he wishes  Nicotine dependence, smokeless tobacco Continues to use smokeless tobacco occasionally for relaxation. Discussed risks of nicotine use, including potential for esophageal cancer. - Encouraged tapering off smokeless tobacco - Suggested alternative relaxation methods  General Health Maintenance Discussed vaccinations, including pneumonia and shingles. He expressed reluctance towards needles but agreed to shingles vaccine due to risk of shingles post-stress. - Administered shingles vaccine today - Scheduled second shingles vaccine dose in 2 months      Return in about 8 weeks (around 04/08/2024) for nurse BP visit/shingrix 2 shot.    Evelise Reine T Mikaiya Tramble, PA-C

## 2024-02-18 ENCOUNTER — Other Ambulatory Visit (HOSPITAL_BASED_OUTPATIENT_CLINIC_OR_DEPARTMENT_OTHER): Payer: Self-pay | Admitting: Student

## 2024-02-18 ENCOUNTER — Encounter (HOSPITAL_BASED_OUTPATIENT_CLINIC_OR_DEPARTMENT_OTHER): Payer: Self-pay

## 2024-02-18 DIAGNOSIS — G8929 Other chronic pain: Secondary | ICD-10-CM

## 2024-02-19 ENCOUNTER — Telehealth (HOSPITAL_BASED_OUTPATIENT_CLINIC_OR_DEPARTMENT_OTHER): Payer: Self-pay

## 2024-02-19 NOTE — Telephone Encounter (Signed)
 Pt will be seeing Marlyce Patterson with [facility]. Pt said BP is better. Ranging 120's/80' and leg sensation he was having also subsided since being on higher dose on losartan . FYI to PCP.

## 2024-02-25 DIAGNOSIS — M545 Low back pain, unspecified: Secondary | ICD-10-CM | POA: Diagnosis not present

## 2024-02-25 DIAGNOSIS — G8929 Other chronic pain: Secondary | ICD-10-CM | POA: Diagnosis not present

## 2024-02-25 DIAGNOSIS — Z1330 Encounter for screening examination for mental health and behavioral disorders, unspecified: Secondary | ICD-10-CM | POA: Diagnosis not present

## 2024-02-25 DIAGNOSIS — M25552 Pain in left hip: Secondary | ICD-10-CM | POA: Diagnosis not present

## 2024-02-25 DIAGNOSIS — I10 Essential (primary) hypertension: Secondary | ICD-10-CM | POA: Diagnosis not present

## 2024-02-25 DIAGNOSIS — M5431 Sciatica, right side: Secondary | ICD-10-CM | POA: Diagnosis not present

## 2024-03-04 ENCOUNTER — Encounter: Payer: Self-pay | Admitting: Sports Medicine

## 2024-03-04 ENCOUNTER — Ambulatory Visit: Admitting: Sports Medicine

## 2024-03-04 DIAGNOSIS — M1612 Unilateral primary osteoarthritis, left hip: Secondary | ICD-10-CM

## 2024-03-04 DIAGNOSIS — G8929 Other chronic pain: Secondary | ICD-10-CM

## 2024-03-04 DIAGNOSIS — M5134 Other intervertebral disc degeneration, thoracic region: Secondary | ICD-10-CM | POA: Diagnosis not present

## 2024-03-04 DIAGNOSIS — M5136 Other intervertebral disc degeneration, lumbar region with discogenic back pain only: Secondary | ICD-10-CM | POA: Diagnosis not present

## 2024-03-04 DIAGNOSIS — R29898 Other symptoms and signs involving the musculoskeletal system: Secondary | ICD-10-CM | POA: Diagnosis not present

## 2024-03-04 NOTE — Progress Notes (Signed)
 Taylan Marez - 53 y.o. male MRN [id number]  Date of birth: 1970-06-29  Office Visit Note: Visit Date: 03/04/2024 PCP: Iven Lang DASEN, PA-C Referred by: Iven Lang DASEN, PA-C  Subjective: Chief Complaint  Patient presents with   Lower Back - Pain   Left Hip - Pain   HPI: Bayden Gil is a pleasant 53 y.o. male who presents today for chronic low back pain and left hip pain.  Carwin is presenting for evaluation of chronic low back pain as well as left > right hip pain.  He has a history of low back pain for years and does have degenerative disc disease of the thoracic and lumbar spine.  He has had occasional exacerbations which require oral prednisone  to help ease off his pain, he is also taking tramadol  in the past for breakthrough pain.  He has difficulty with certain activities, it is worse when he has to sit in the car for longer periods of time.  This makes travel difficult.  When he hits a bump on the road he will feel a sharp pain that travels up his low back and spine.  He has been following with Dr. DASEN in the past for this, does currently work at home because of this in his hip pain.  He does have x-rays of the hip which show at least mild osteoarthritic change of the left > right hip.  He is also been treated for trochanteric bursitis in the past with injections that have helped.  He has pain with in the groin and the lateral hip.  He is unable to sit with his legs crossed without his pain significantly increasing.  He has trialed physical therapy in the past but has not done any recently as he has not found a good PT to give him the right treatment modalities.  He is open to this however.  He was a previous patient of Dr. DASEN in [place].  He had been following him for acute on chronic low back pain.  Previous imaging showed thoracic and lumbar degenerative disc disease.  Has had several occurrences of flares where he had taken prednisone  and tramadol  for breakthrough pain.  Also  for the left > right hip pain with at least mild arthritic change and history of trochanteric bursitis/GT PS that has responded to injections in years past.  Notes reviewed from 11/05/2023, 02/16/2023, 06/20/2022, 05/23/2022.  *Independent note review from spine scoliosis specialist at [facility] from 02/25/2024 was reviewed today. He is being evaluated for his lower back pain. Patient is presenting LBP with pain in the left and right hips. L>R hip. Pain is worse while sitting down. Patient cannot cross his legs while witting this increased his pain greatly. He has done some [facility] a little over a year ago, without much relief. Pain started about 2017. In 2017 he had xrays of hip again in 2024. Xrays in 2024 report: Mild bilateral hip joint degenerative changes. I also reviewed a a CT scan that was performed for urologic purposes but I can see the spine there. He has multilevel anterior thoracic osteophytes from T6-L1, but lumbar spine relatively preserved. On the study, I can see loss of the joint space on the right side.  I discussed with the patient diagnosis and treatment options. At least for now, imaging wise, his pathology is worse on the left hip than the spine. I will order at this point a left hip MRI. Consider injections depending on hip MRI  review. If hip MRI is negative, then consider advanced lumbar imaging. Pertinent ROS were reviewed with the patient and found to be negative unless otherwise specified above in HPI.   Assessment & Plan: Visit Diagnoses:  1. Degeneration of intervertebral disc of lumbar region with discogenic back pain   2. DDD (degenerative disc disease), thoracic   3. Chronic bilateral low back pain without sciatica   4. Primary osteoarthritis of left hip   5. Weakness of left hip    Plan: Impression is chronic mid to low back pain with both thoracic and lumbar degenerative disc disease.  There is no instability noted on flexion/extension views.  He does have flares  of the low back that is bothered by axial loading, prolonged sitting/standing, in a car.  Given this he does work from home which is helpful.  I did refill out a work restriction note which Dr. ONEIDA had previous filled out for him.  I do think it would be good to get him started in formalized physical therapy for the back to work on posterior chain stability and back strengthening/stability.  We will send a referral to [facility] in [place].  They will work on the low back as well as range of motion and strengthening about the left hip joint.  X-rays do revealed at least mild osteoarthritis but he does have hip abduction weakness noted on examination today.  He will work through PT with this.  He is fine to continue using Tylenol /ibuprofen as well as his muscle relaxer as needed.  I will see him back in about 2 months to reevaluate how he is doing.  Previously with Dr. ONEIDA, they did discuss that if his back pain continues, next step would be consideration of MRI for possible ESI/lumbar based injections, more so for pain as he does not have true radicular symptoms at this time.  Follow-up: Return in about 2 months (around 05/04/2024) for F/u LBP, Left hip pain .   Meds & Orders: No orders of the defined types were placed in this encounter.   Orders Placed This Encounter  Procedures   Ambulatory referral to Physical Therapy     Procedures: No procedures performed      Clinical History: No specialty comments available.  He reports that he has been smoking cigarettes. He has been exposed to tobacco smoke. He has never used smokeless tobacco.  Recent Labs    04/30/23 1430  HGBA1C 5.1    Objective:    Physical Exam  Gen: Well-appearing, in no acute distress; non-toxic CV: Well-perfused. Warm.  Resp: Breathing unlabored on room air; no wheezing. Psych: Fluid speech in conversation; appropriate affect; normal thought process  Ortho Exam - Lumbar: No specific midline spinous  process TTP.  There is bilateral paraspinal hypertonicity from the upper to lower lumbar spine.  There is full range of motion with flexion and extension.  Axial loading does reproduce some pain.  Negative straight leg raise.  - Left hip: No specific tenderness over the greater trochanteric region or bursitis appreciable.  There is no bony restriction with internal or external logroll although FADIR does reproduce pain in the left hip.  There is associated weakness with hip abduction testing on the left greater than right hip.  Imaging:  *I did personally review lumbar spine x-rays with flexion/extension views from 02/16/2023 as well as hip x-rays from 05/23/2022 including lumbar spine AP film from 01/03/2020 during the visit today.  Narrative & Impression  CLINICAL  DATA:  Reevaluation lumbar transverse process noted at [facility] on a CT   EXAM: LUMBAR SPINE FLEX AND EXTEND ONLY - 3 VIEW   COMPARISON:  01/03/2020.   FINDINGS: Flexion, extension and lateral views demonstrate no motion with flexion and extension to suggest instability. Multilevel degenerative changes with small marginal osteophytes.   IMPRESSION: Degenerative changes. No evidence of instability. Limited study that did not include an AP projection.     Electronically Signed   By: Fonda Field M.D.   On: 03/08/2023 11:51    Narrative & Impression  CLINICAL DATA:  Anterolateral hip pain.   EXAM: DG HIP (WITH OR WITHOUT PELVIS) 2-3V LEFT   COMPARISON:  Hip radiograph 03/03/2016   FINDINGS: Normal anatomic alignment. No evidence for acute fracture or dislocation. Mild bilateral hip joint degenerative changes. Pubic symphysis degenerative changes. Pelvic phleboliths.   IMPRESSION: Mild bilateral hip joint degenerative changes.     Electronically Signed   By: Bard Moats M.D.   On: 05/24/2022 20:39    Past Medical/Family/Surgical/Social History: Medications & Allergies reviewed per EMR, new  medications updated. Patient Active Problem List   Diagnosis Date Noted   Neuropathy 02/11/2024   Primary osteoarthritis of both knees 11/05/2023   Seasonal allergies 08/10/2023   Pulmonary emphysema (HCC) 08/10/2023   Family history of cardiovascular disease 08/10/2023   Chronic cough 08/10/2023   Primary hypertension 04/30/2023   Tinea pedis of right foot 04/30/2023   Insomnia 04/30/2023   Fracture of transverse process of lumbar vertebra (HCC) 02/16/2023   Impingement syndrome, shoulder, right 10/05/2020   Chronic low back pain 01/03/2020   Arthropathy of cervical facet joint 09/16/2016   Right foot pain 03/31/2016   Primary osteoarthritis of left hip 03/03/2016   AC separation, type 2 06/18/2015   Right cervical radiculopathy 06/18/2015   Past Medical History:  Diagnosis Date   Acute non-recurrent frontal sinusitis 08/17/2020   Back injury    Cough 08/17/2020   Dermatitis 04/20/2019   Hemorrhoids 04/04/2020   SVT (supraventricular tachycardia)    Family History  Problem Relation Age of Onset   Macular degeneration Mother    Liver cancer Father        had whipple surgery   Post-traumatic stress disorder Brother    Past Surgical History:  Procedure Laterality Date   ANKLE SURGERY     CARDIAC SURGERY  2006   Ablation    HAND SURGERY     KNEE SURGERY     Social History   Occupational History   Not on file  Tobacco Use   Smoking status: Some Days    Types: Cigarettes    Passive exposure: Past   Smokeless tobacco: Never   Tobacco comments:    HAD QUIT 10 YEARS   Vaping Use   Vaping status: Never Used  Substance and Sexual Activity   Alcohol use: Yes    Comment: 6 pack a week   Drug use: No   Sexual activity: Not on file   I spent 60 minutes in the care of the patient today including face-to-face time, preparation to see the patient, as well as independent review and interpretation of previous radiographs, review of previous sports medicine notes from Dr.  ONEIDA, review of care everywhere including imaging and plan of care from spine and scoliosis clinic, discussion on activity modification, work restrictions as well as physical therapy recommendations, time spent for evaluation and filling out paperwork for 'Work from Home and Intermittent Leave' for employer [employer]) for the above diagnoses.   Lonell Sprang, DO Primary Care Sports Medicine Physician  [facility]  This note was dictated using Dragon naturally speaking software and may contain errors in syntax, spelling, or content which have not been identified prior to signing this note.

## 2024-03-04 NOTE — Progress Notes (Signed)
 Patient says that he has had low back and left hip pain for years. He describes his low back pain as more stiffness, and says that with more activity, the side of his left hip will become painful. He has recently had a change in sensation in the dorsal aspect of his right foot, as well. He occasionally takes Ibuprofen and a muscle relaxer, but prefers not to take much medicine.  Patient has been evaluated for these symptoms in the past, but is changing physicians as his original physician no longer working for the practice. He has been approved to work from home, and would like to continue to have work notes stating that he can work from home.

## 2024-03-08 ENCOUNTER — Telehealth (HOSPITAL_BASED_OUTPATIENT_CLINIC_OR_DEPARTMENT_OTHER): Payer: Self-pay

## 2024-03-08 NOTE — Telephone Encounter (Signed)
 Pt states he went and saw Sports Medicine in [place]. See OV dated 03/04/2024. They have set him up with some PT. He said he will do the treatment he advised. He does not need any  forms from Pcp for now. Pt said he wanted to thank Lang for keeping track of him.

## 2024-03-08 NOTE — Telephone Encounter (Signed)
-----   Message from Lang DASEN Rothfuss sent at 03/04/2024  9:27 AM EST ----- Looks like he did not want to meet with Physical Therapy since he is now meeting with a spinal doc. Was he able to get his accommodations done through them? ----- Message ----- From: Joshua Shane RAMAN, CMA Sent: 03/03/2024   2:25 PM EST To: Lang DASEN [place], PA-C   ----- Message ----- From: Macario Boby LABOR Sent: 02/29/2024   9:39 AM EST To: Shane RAMAN Joshua, CMA

## 2024-03-13 ENCOUNTER — Encounter: Payer: Self-pay | Admitting: Sports Medicine

## 2024-03-14 ENCOUNTER — Encounter (HOSPITAL_BASED_OUTPATIENT_CLINIC_OR_DEPARTMENT_OTHER): Payer: Self-pay | Admitting: Student

## 2024-03-16 ENCOUNTER — Encounter: Payer: Self-pay | Admitting: Sports Medicine

## 2024-03-16 NOTE — Progress Notes (Signed)
 Addendum:  George Nelson has dealt with a chronic history of thoracic and low back pain.  He does have degenerative disc disease of the thoracic and lumbar spine.  In the past he was approved for working from home given that episodes of prolonged sitting/standing in the office as well as specifically driving in the car for any distances longer than 5-10 minutes would flareup his back pain. He did describe when he goes over any subtle bumps in the road he will feel worsening pain shooting up his lumbar and thoracic spine.  Given this he has kept his exacerbations at bay be able to work from home and keeping his symptoms under control this way and prevent exacerbation.   I am requesting that work consider this when evaluating him working from home.  George Sprang, DO Primary Care Sports Medicine Physician  [facility]

## 2024-03-21 ENCOUNTER — Ambulatory Visit (INDEPENDENT_AMBULATORY_CARE_PROVIDER_SITE_OTHER)

## 2024-03-21 DIAGNOSIS — J439 Emphysema, unspecified: Secondary | ICD-10-CM | POA: Diagnosis not present

## 2024-03-21 DIAGNOSIS — R0789 Other chest pain: Secondary | ICD-10-CM

## 2024-03-21 NOTE — Progress Notes (Signed)
 Patient is in office today for a nurse visit for Pulmonary Function Test.

## 2024-03-23 ENCOUNTER — Telehealth (HOSPITAL_BASED_OUTPATIENT_CLINIC_OR_DEPARTMENT_OTHER): Payer: Self-pay

## 2024-03-23 NOTE — Telephone Encounter (Signed)
 Called pt to inform him PFT were normal. Pt advised to call if he feels he needs retesting in future.

## 2024-04-11 ENCOUNTER — Ambulatory Visit (INDEPENDENT_AMBULATORY_CARE_PROVIDER_SITE_OTHER): Admitting: Student

## 2024-04-11 ENCOUNTER — Encounter (HOSPITAL_BASED_OUTPATIENT_CLINIC_OR_DEPARTMENT_OTHER): Payer: Self-pay | Admitting: Student

## 2024-04-11 VITALS — BP 137/88 | HR 65 | Temp 97.8°F | Resp 16 | Ht 66.0 in | Wt 190.0 lb

## 2024-04-11 DIAGNOSIS — I10 Essential (primary) hypertension: Secondary | ICD-10-CM

## 2024-04-11 DIAGNOSIS — D7589 Other specified diseases of blood and blood-forming organs: Secondary | ICD-10-CM | POA: Diagnosis not present

## 2024-04-11 DIAGNOSIS — Z136 Encounter for screening for cardiovascular disorders: Secondary | ICD-10-CM

## 2024-04-11 DIAGNOSIS — J439 Emphysema, unspecified: Secondary | ICD-10-CM | POA: Diagnosis not present

## 2024-04-11 DIAGNOSIS — Z13220 Encounter for screening for lipoid disorders: Secondary | ICD-10-CM | POA: Diagnosis not present

## 2024-04-11 NOTE — Progress Notes (Signed)
 "  Established Patient Office Visit  Subjective   Patient ID: George Nelson, male    DOB: December 23, 1970  Age: 53 y.o. MRN: [id number]  Chief Complaint  Patient presents with   Medical Management of Chronic Issues    Follow up.     HPI  George Nelson is a 53 year old male with hypertension who presents for follow-up of his blood pressure management.  He is currently taking 100 mg of losartan , increased from 50 mg, and notes an overall improvement in his blood pressure, though he has not been checking it regularly at home. He associates redness in his neck and improved sleep with his blood pressure management. He no longer experiences swelling in his left eye, which was present before starting the medication. He has a blood pressure cuff at home but has not been using it consistently.  He describes stress related to his 57 year old son, who lives with him and has a girlfriend, as a contributing factor to his blood pressure. A recent argument with his son, whom he perceives as sometimes immature, has added to his stress.  He acknowledges a diet high in sodium, particularly during the holidays, and believes he is sensitive to salt. He has experienced ankle swelling in the past, which he attributes to sodium intake.  He has chronic pain in his left hip and back, ongoing since 2017, which he attributes to sports activities. The pain is significant, especially when walking, and he is under the care of an orthopedic specialist. He experiences numbness and clamminess in his foot and has a history of a back injury in 2024.  He has a history of smoking, starting at age 22 and continuing for about 20 years at a pack per day. He has since reduced his smoking significantly and now occasionally uses chewing tobacco. He has a family history of smoking and has been exposed to secondhand smoke from a young age.  He mentions a past episode of pneumonia in 2019 and a history of asthma-like symptoms, including  wheezing and respiratory infections. He has a history of heartburn.  Patient Active Problem List   Diagnosis Date Noted   Neuropathy 02/11/2024   Primary osteoarthritis of both knees 11/05/2023   Seasonal allergies 08/10/2023   Pulmonary emphysema (HCC) 08/10/2023   Family history of cardiovascular disease 08/10/2023   Primary hypertension 04/30/2023   Tinea pedis of right foot 04/30/2023   Insomnia 04/30/2023   Fracture of transverse process of lumbar vertebra (HCC) 02/16/2023   Impingement syndrome, shoulder, right 10/05/2020   Chronic low back pain 01/03/2020   Arthropathy of cervical facet joint 09/16/2016   Right foot pain 03/31/2016   Primary osteoarthritis of left hip 03/03/2016   AC separation, type 2 06/18/2015   Right cervical radiculopathy 06/18/2015   Past Medical History:  Diagnosis Date   Acute non-recurrent frontal sinusitis 08/17/2020   Back injury    Cough 08/17/2020   Dermatitis 04/20/2019   Hemorrhoids 04/04/2020   SVT (supraventricular tachycardia)    Social History[1] Allergies[2]    ROS Per HPI.    Objective:     BP 137/88   Pulse 65   Temp 97.8 F (36.6 C) (Oral)   Resp 16   Ht 5' 6 (1.676 m)   Wt 190 lb (86.2 kg)   SpO2 98%   BMI 30.67 kg/m  BP Readings from Last 3 Encounters:  04/11/24 137/88  02/12/24 (!) 152/91  02/11/24 (!) 148/95   Wt Readings from Last 3  Encounters:  04/11/24 190 lb (86.2 kg)  02/12/24 193 lb 6.4 oz (87.7 kg)  02/11/24 182 lb (82.6 kg)   SpO2 Readings from Last 3 Encounters:  04/11/24 98%  02/12/24 97%  02/11/24 97%      Physical Exam Constitutional:      General: He is not in acute distress.    Appearance: Normal appearance. He is not ill-appearing.  HENT:     Head: Normocephalic and atraumatic.     Right Ear: External ear normal.     Left Ear: External ear normal.     Nose: Nose normal.  Eyes:     Conjunctiva/sclera: Conjunctivae normal.  Cardiovascular:     Rate and Rhythm: Normal rate  and regular rhythm.     Pulses: Normal pulses.     Heart sounds: Normal heart sounds. No murmur heard.    No friction rub.  Pulmonary:     Effort: Pulmonary effort is normal. No respiratory distress.     Breath sounds: Normal breath sounds. No wheezing, rhonchi or rales.  Musculoskeletal:     Right lower leg: No edema.     Left lower leg: No edema.  Skin:    General: Skin is warm and dry.     Coloration: Skin is not jaundiced or pale.  Neurological:     Mental Status: He is alert.  Psychiatric:        Mood and Affect: Mood normal.        Behavior: Behavior normal.      No results found for any visits on 04/11/24.  Last CBC Lab Results  Component Value Date   WBC 5.6 04/30/2023   HGB 14.9 04/30/2023   HCT 44.0 04/30/2023   MCV 98 (H) 04/30/2023   MCH 33.3 (H) 04/30/2023   RDW 12.2 04/30/2023   PLT 255 04/30/2023   Last metabolic panel Lab Results  Component Value Date   GLUCOSE 94 04/30/2023   NA 141 04/30/2023   K 4.4 04/30/2023   CL 103 04/30/2023   CO2 24 04/30/2023   BUN 8 04/30/2023   CREATININE 0.71 (L) 04/30/2023   EGFR 110 04/30/2023   CALCIUM 9.7 04/30/2023   PROT 7.1 04/30/2023   ALBUMIN 4.8 04/30/2023   LABGLOB 2.3 04/30/2023   BILITOT 0.6 04/30/2023   ALKPHOS 72 04/30/2023   AST 25 04/30/2023   ALT 29 04/30/2023   Last lipids Lab Results  Component Value Date   CHOL 180 04/30/2023   HDL 75 04/30/2023   LDLCALC 93 04/30/2023   TRIG 61 04/30/2023   CHOLHDL 2.4 04/30/2023   Last hemoglobin A1c Lab Results  Component Value Date   HGBA1C 5.1 04/30/2023      The 10-year ASCVD risk score (Arnett DK, et al., 2019) is: 4.1%    Assessment & Plan:   Assessment and Plan Primary hypertension Chronic, stable although blood pressure is slightly elevated at 137/88 mmHg, above the target of less than 130/80 mmHg. Recent increase in losartan  to 100 mg has improved control, but home monitoring is needed. Sodium intake and weight may contribute to  elevated blood pressure. Chronic pain may also contribute to elevated blood pressure. - Monitor blood pressure at home twice daily for 1-2 weeks and report readings. - Reduce sodium intake and consider DASH diet. - Encouraged weight loss to aid in blood pressure control. - Continue losartan  100 mg daily. - Will consider adding a second antihypertensive medication if blood pressure remains elevated.  Chronic back and left hip  pain with neuropathic features Chronic pain in the left hip and back with neuropathic features, worsening since 2017. Pain is severe and impacts daily activities. Currently under the care of an orthopedic specialist. Pain management options are being considered. Pain may contribute to elevated blood pressure. - Continue working with orthopedic specialist for pain management. - Consider pain management options such as injections or nerve blocks. - Document pain levels and triggers for further evaluation.  Pulmonary emphysema/Negative for COPD Imaging changes suggestive of emphysema, but no clinical symptoms of COPD. Pulmonary function tests are normal. Smoking and secondhand smoke exposure may have contributed to changes. Occasional wheezing and asthma-like symptoms reported. - Continue to monitor respiratory symptoms and pulmonary function. - Will consider endoscopy in the future to evaluate for esophageal cancer risk due to tobacco use.  General health maintenance Routine health maintenance discussed, including the need for regular screenings and lifestyle modifications. Recent labs are due for re-evaluation. - Ordered CBC, CMP, cholesterol, and B12 levels. - Will schedule annual physical exam in six months. - Discussed potential endoscopy in the future due to tobacco use.  I personally spent a total of 36 minutes in the care of the patient today including preparing to see the patient, getting/reviewing separately obtained history, performing a medically appropriate  exam/evaluation, counseling and educating, placing orders, and documenting clinical information in the EHR.   Return in about 6 months (around 10/10/2024) for Annual Physical.    George T Anona Giovannini, PA-C    [1]  Social History Tobacco Use   Smoking status: Former    Current packs/day: 0.00    Types: Cigarettes    Quit date: 1986    Years since quitting: 40.0    Passive exposure: Past   Smokeless tobacco: Never   Tobacco comments:    HAD QUIT 10 YEARS. Pt prefers not to discuss.  Vaping Use   Vaping status: Never Used  Substance Use Topics   Alcohol use: Yes    Comment: 6 pack a week maybe   Drug use: No  [2] No Known Allergies  "

## 2024-04-11 NOTE — Patient Instructions (Signed)
 It was nice to see you today!  If you have any problems before your next visit feel free to message me via MyChart (minor issues or questions) or call the office, otherwise you may reach out to schedule an office visit.  Thank you! Pau Banh, PA-C

## 2024-04-12 LAB — B12 AND FOLATE PANEL
Folate: 18.8 ng/mL
Vitamin B-12: 1238 pg/mL (ref 232–1245)

## 2024-04-12 LAB — LIPID PANEL
Chol/HDL Ratio: 3.1 ratio (ref 0.0–5.0)
Cholesterol, Total: 208 mg/dL — ABNORMAL HIGH (ref 100–199)
HDL: 68 mg/dL
LDL Chol Calc (NIH): 123 mg/dL — ABNORMAL HIGH (ref 0–99)
Triglycerides: 97 mg/dL (ref 0–149)
VLDL Cholesterol Cal: 17 mg/dL (ref 5–40)

## 2024-04-12 LAB — CBC WITH DIFFERENTIAL/PLATELET
Basophils Absolute: 0.1 x10E3/uL (ref 0.0–0.2)
Basos: 1 %
EOS (ABSOLUTE): 0.1 x10E3/uL (ref 0.0–0.4)
Eos: 2 %
Hematocrit: 43.1 % (ref 37.5–51.0)
Hemoglobin: 14.6 g/dL (ref 13.0–17.7)
Immature Grans (Abs): 0 x10E3/uL (ref 0.0–0.1)
Immature Granulocytes: 0 %
Lymphocytes Absolute: 1.7 x10E3/uL (ref 0.7–3.1)
Lymphs: 24 %
MCH: 33 pg (ref 26.6–33.0)
MCHC: 33.9 g/dL (ref 31.5–35.7)
MCV: 97 fL (ref 79–97)
Monocytes Absolute: 0.5 x10E3/uL (ref 0.1–0.9)
Monocytes: 8 %
Neutrophils Absolute: 4.6 x10E3/uL (ref 1.4–7.0)
Neutrophils: 65 %
Platelets: 249 x10E3/uL (ref 150–450)
RBC: 4.43 x10E6/uL (ref 4.14–5.80)
RDW: 11.9 % (ref 11.6–15.4)
WBC: 7 x10E3/uL (ref 3.4–10.8)

## 2024-04-12 LAB — COMPREHENSIVE METABOLIC PANEL WITH GFR
ALT: 29 IU/L (ref 0–44)
AST: 25 IU/L (ref 0–40)
Albumin: 4.7 g/dL (ref 3.8–4.9)
Alkaline Phosphatase: 71 IU/L (ref 47–123)
BUN/Creatinine Ratio: 13 (ref 9–20)
BUN: 13 mg/dL (ref 6–24)
Bilirubin Total: 0.6 mg/dL (ref 0.0–1.2)
CO2: 24 mmol/L (ref 20–29)
Calcium: 9.4 mg/dL (ref 8.7–10.2)
Chloride: 103 mmol/L (ref 96–106)
Creatinine, Ser: 0.98 mg/dL (ref 0.76–1.27)
Globulin, Total: 2.2 g/dL (ref 1.5–4.5)
Glucose: 95 mg/dL (ref 70–99)
Potassium: 3.9 mmol/L (ref 3.5–5.2)
Sodium: 141 mmol/L (ref 134–144)
Total Protein: 6.9 g/dL (ref 6.0–8.5)
eGFR: 92 mL/min/1.73

## 2024-04-13 ENCOUNTER — Ambulatory Visit (HOSPITAL_BASED_OUTPATIENT_CLINIC_OR_DEPARTMENT_OTHER): Payer: Self-pay | Admitting: Student

## 2024-04-25 ENCOUNTER — Other Ambulatory Visit (HOSPITAL_BASED_OUTPATIENT_CLINIC_OR_DEPARTMENT_OTHER): Payer: Self-pay | Admitting: Student

## 2024-05-23 ENCOUNTER — Ambulatory Visit: Admitting: Sports Medicine

## 2024-06-14 ENCOUNTER — Ambulatory Visit: Admitting: Diagnostic Neuroimaging

## 2024-07-12 ENCOUNTER — Ambulatory Visit: Admitting: Diagnostic Neuroimaging

## 2024-10-06 ENCOUNTER — Encounter (HOSPITAL_BASED_OUTPATIENT_CLINIC_OR_DEPARTMENT_OTHER): Admitting: Student
